# Patient Record
Sex: Male | Born: 1940 | Hispanic: No | Marital: Married
Health system: Southern US, Community
[De-identification: ages and names within clinical notes are randomized; demographics above are authoritative.]

---

## 2006-05-28 ENCOUNTER — Emergency Department: Payer: Self-pay | Admitting: Emergency Medicine

## 2006-07-06 ENCOUNTER — Emergency Department: Payer: Self-pay | Admitting: Emergency Medicine

## 2007-07-23 ENCOUNTER — Inpatient Hospital Stay: Payer: Self-pay | Admitting: Internal Medicine

## 2007-07-23 ENCOUNTER — Other Ambulatory Visit: Payer: Self-pay

## 2007-10-26 ENCOUNTER — Other Ambulatory Visit: Payer: Self-pay

## 2007-10-26 ENCOUNTER — Emergency Department: Payer: Self-pay | Admitting: Emergency Medicine

## 2008-08-05 ENCOUNTER — Inpatient Hospital Stay: Payer: Self-pay | Admitting: Psychiatry

## 2008-08-11 ENCOUNTER — Inpatient Hospital Stay: Payer: Self-pay | Admitting: Internal Medicine

## 2008-12-17 ENCOUNTER — Ambulatory Visit: Payer: Self-pay | Admitting: Internal Medicine

## 2008-12-24 ENCOUNTER — Ambulatory Visit: Payer: Self-pay | Admitting: Internal Medicine

## 2008-12-24 ENCOUNTER — Inpatient Hospital Stay: Payer: Self-pay | Admitting: Specialist

## 2009-01-17 ENCOUNTER — Ambulatory Visit: Payer: Self-pay | Admitting: Internal Medicine

## 2009-03-17 ENCOUNTER — Ambulatory Visit: Payer: Self-pay | Admitting: Internal Medicine

## 2009-03-25 ENCOUNTER — Ambulatory Visit: Payer: Self-pay | Admitting: Internal Medicine

## 2009-03-27 ENCOUNTER — Ambulatory Visit: Payer: Self-pay | Admitting: Internal Medicine

## 2009-04-17 ENCOUNTER — Ambulatory Visit: Payer: Self-pay | Admitting: Internal Medicine

## 2009-04-27 ENCOUNTER — Inpatient Hospital Stay: Payer: Self-pay | Admitting: Internal Medicine

## 2009-05-17 ENCOUNTER — Ambulatory Visit: Payer: Self-pay | Admitting: Internal Medicine

## 2009-08-26 ENCOUNTER — Ambulatory Visit: Payer: Self-pay | Admitting: Internal Medicine

## 2009-09-17 ENCOUNTER — Ambulatory Visit: Payer: Self-pay | Admitting: Internal Medicine

## 2010-01-08 ENCOUNTER — Ambulatory Visit: Payer: Self-pay | Admitting: Internal Medicine

## 2010-01-17 ENCOUNTER — Ambulatory Visit: Payer: Self-pay | Admitting: Internal Medicine

## 2010-07-19 ENCOUNTER — Ambulatory Visit: Payer: Self-pay | Admitting: Internal Medicine

## 2010-08-18 ENCOUNTER — Ambulatory Visit: Payer: Self-pay | Admitting: Internal Medicine

## 2011-01-19 ENCOUNTER — Ambulatory Visit: Payer: Self-pay | Admitting: Internal Medicine

## 2011-01-19 LAB — CBC CANCER CENTER
Basophil #: 0 x10 3/mm (ref 0.0–0.1)
Eosinophil #: 0 x10 3/mm (ref 0.0–0.7)
Eosinophil %: 0.4 %
HCT: 42.1 % (ref 40.0–52.0)
HGB: 14 g/dL (ref 13.0–18.0)
Lymphocyte #: 1 x10 3/mm (ref 1.0–3.6)
MCH: 31.5 pg (ref 26.0–34.0)
MCHC: 33.2 g/dL (ref 32.0–36.0)
MCV: 95 fL (ref 80–100)
Monocyte #: 0.4 x10 3/mm (ref 0.0–0.7)
Monocyte %: 14.8 %
Platelet: 137 x10 3/mm — ABNORMAL LOW (ref 150–440)
RBC: 4.43 10*6/uL (ref 4.40–5.90)
WBC: 2.6 x10 3/mm — ABNORMAL LOW (ref 3.8–10.6)

## 2011-01-19 LAB — HEPATIC FUNCTION PANEL A (ARMC)
Albumin: 2.9 g/dL — ABNORMAL LOW (ref 3.4–5.0)
Alkaline Phosphatase: 101 U/L (ref 50–136)
Bilirubin, Direct: 0.1 mg/dL (ref 0.00–0.20)
Bilirubin,Total: 0.4 mg/dL (ref 0.2–1.0)
SGPT (ALT): 9 U/L — ABNORMAL LOW
Total Protein: 8.4 g/dL — ABNORMAL HIGH (ref 6.4–8.2)

## 2011-01-19 LAB — BASIC METABOLIC PANEL
BUN: 12 mg/dL (ref 7–18)
Chloride: 101 mmol/L (ref 98–107)
EGFR (African American): >60
EGFR (Non-African Amer.): >60
Glucose: 89 mg/dL (ref 65–99)
Osmolality: 273 (ref 275–301)
Potassium: 3.9 mmol/L (ref 3.5–5.1)
Sodium: 137 mmol/L (ref 136–145)

## 2011-02-18 ENCOUNTER — Ambulatory Visit: Payer: Self-pay | Admitting: Internal Medicine

## 2011-03-31 IMAGING — CR DG CHEST 1V PORT
1 series · 1 of 1 positions shown · non-contrast
Comparison: none

REASON FOR EXAM: weakness, nausea and vomiting- hx of lung ca
COMMENTS:

[view not recorded]
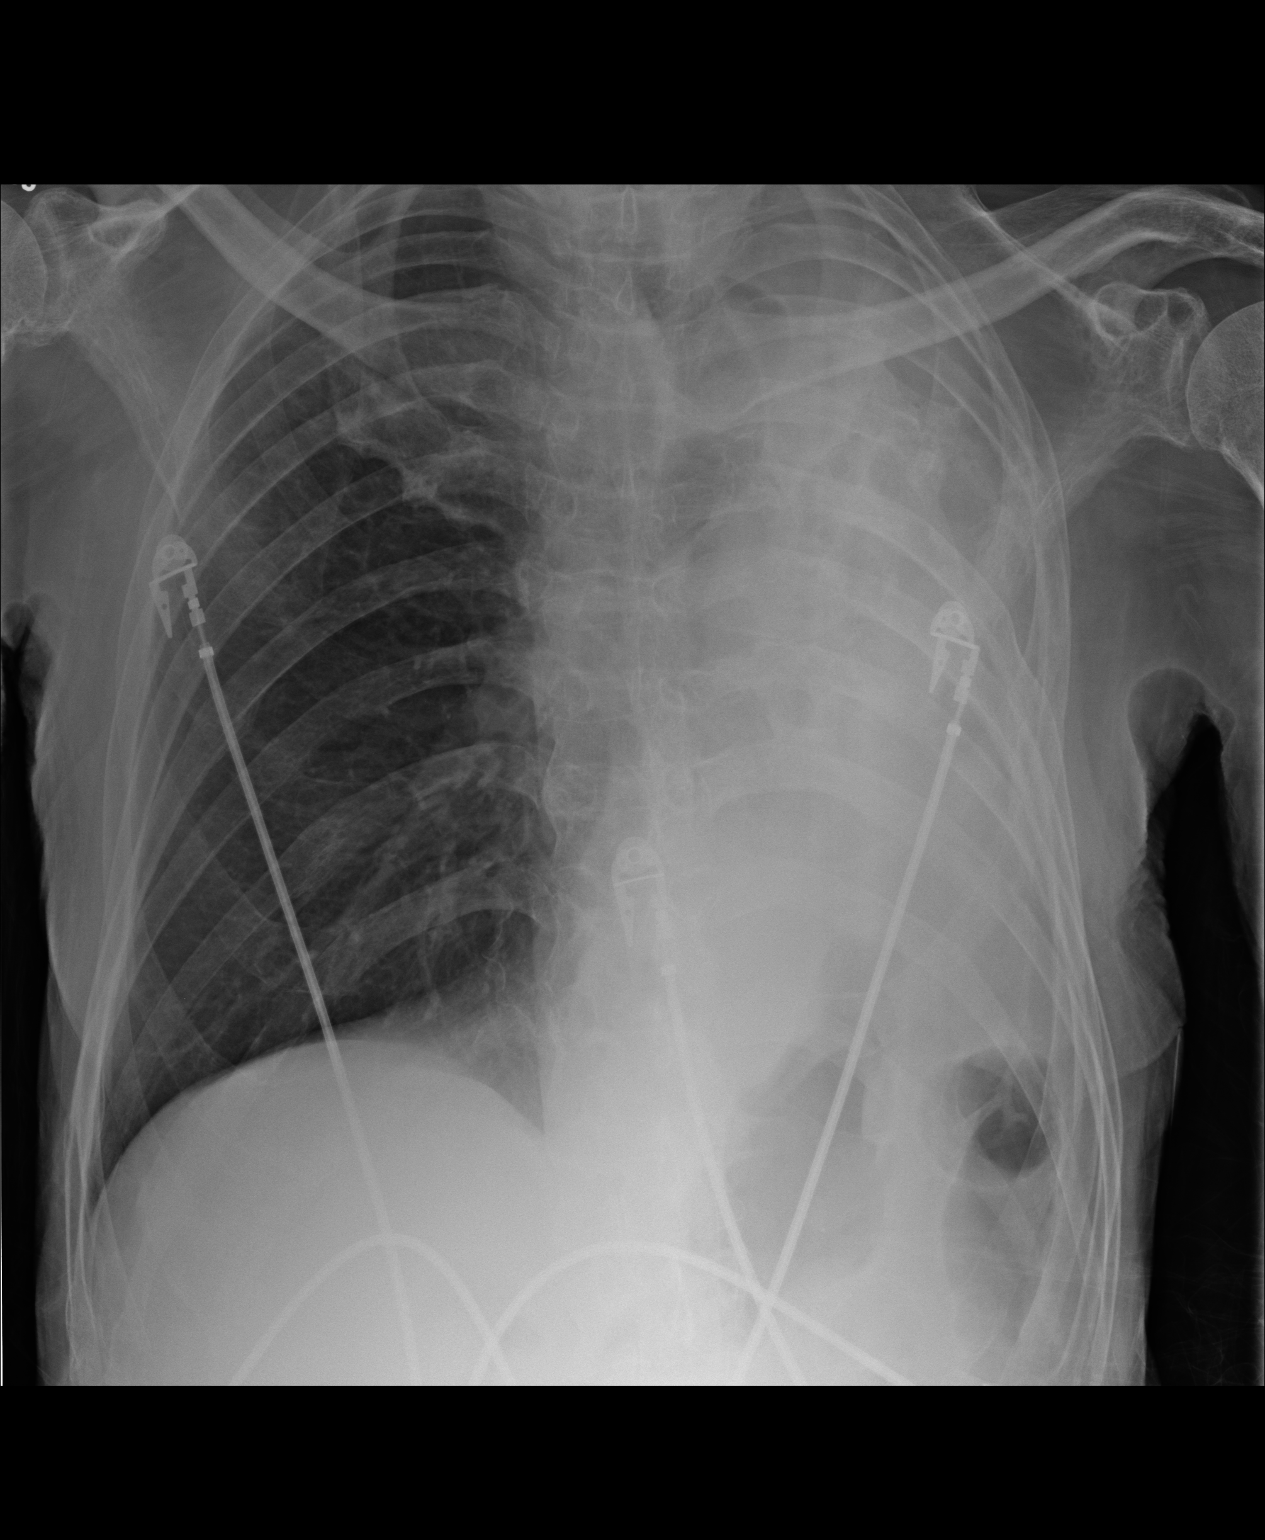

[1 of 1 positions shown; findings below may reference images not displayed]

PROCEDURE:     DXR - DXR PORTABLE CHEST SINGLE VIEW  - December 23, 2008  [DATE]

RESULT:     Comparison is made to the study of 08/05/2008 and to an exam of
08/11/2008. There is continued opacification of the left hemithorax. The
right lung is clear. There is a shift of the trachea toward the left.
Cardiac monitoring electrodes are present.
IMPRESSION: 1.     Stable chest x-ray.
2.     It appears the patient is status post pneumonectomy.
3.     The right lung appears clear.

## 2011-04-01 IMAGING — CT CT CHEST W/ CM
1 of 2 series · 14 of 32 positions shown, 18 images · non-contrast
Comparison: none

REASON FOR EXAM: sob/weakness
COMMENTS:

[Series 6: lung windows · axial · 0.77mm/px · z∈[-390,-126]mm · 14 of 106 slices shown, 18 images]
[im 9/106  mediastinal]
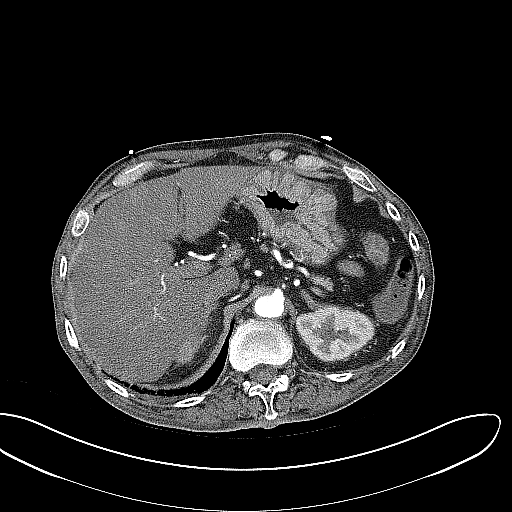
[im 9/106  lung]
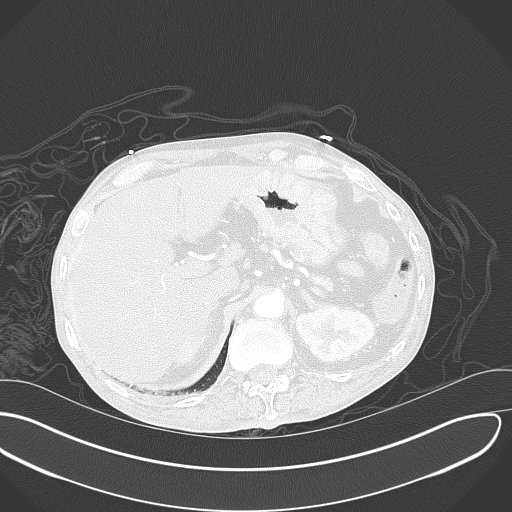
[im 17/106  lung]
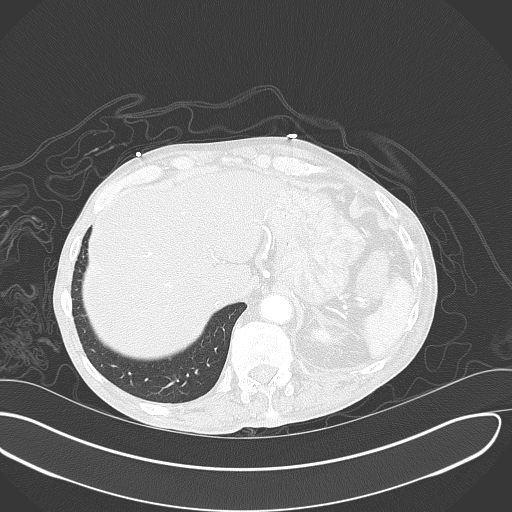
[im 25/106  lung]
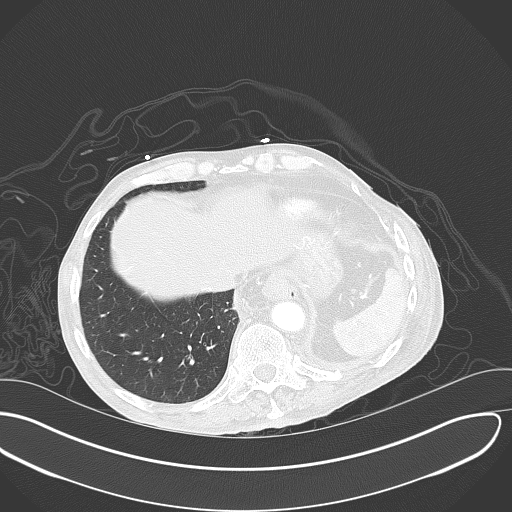
[im 33/106  lung]
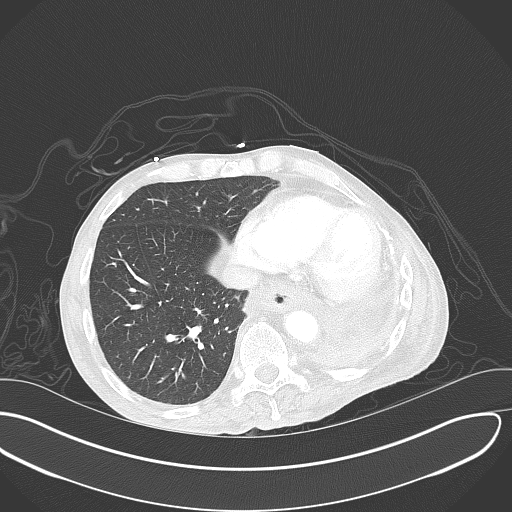
[im 41/106  mediastinal]
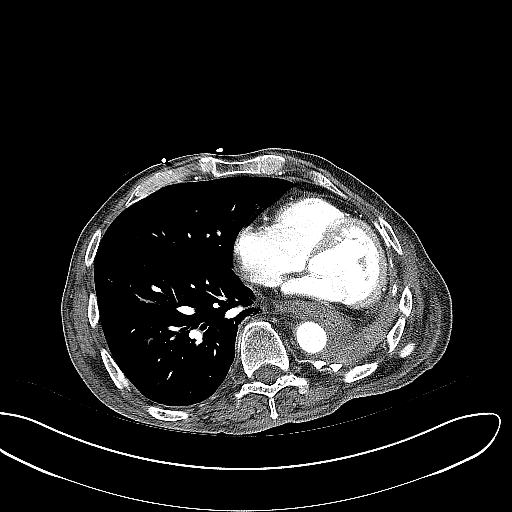
[im 41/106  lung]
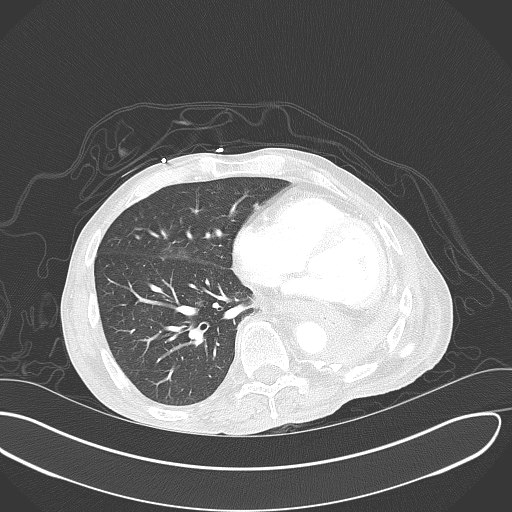
[im 49/106  lung]
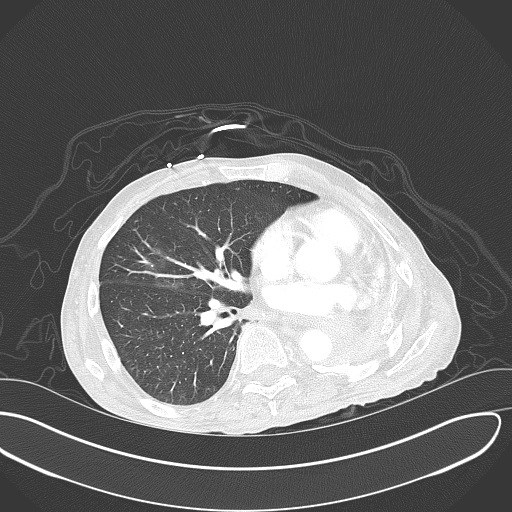
[im 50/106  lung]
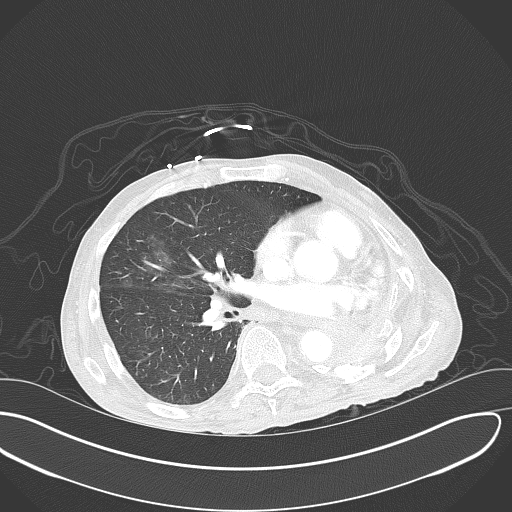
[im 53/106  lung]
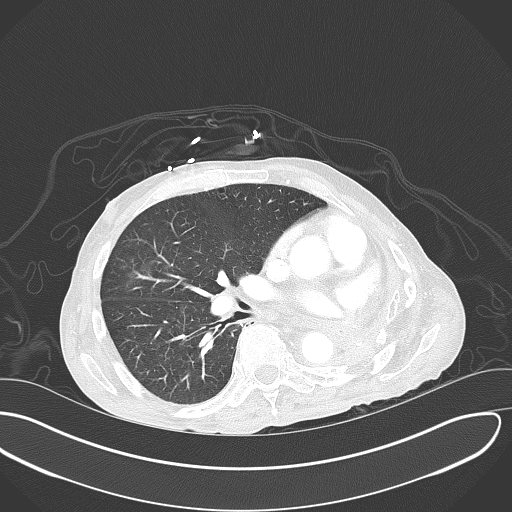
[im 57/106  mediastinal]
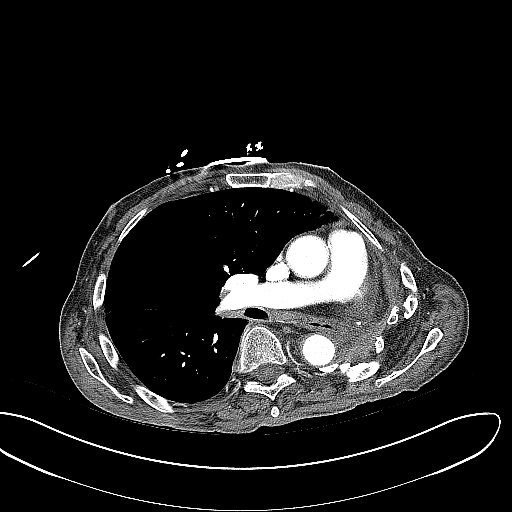
[im 57/106  lung]
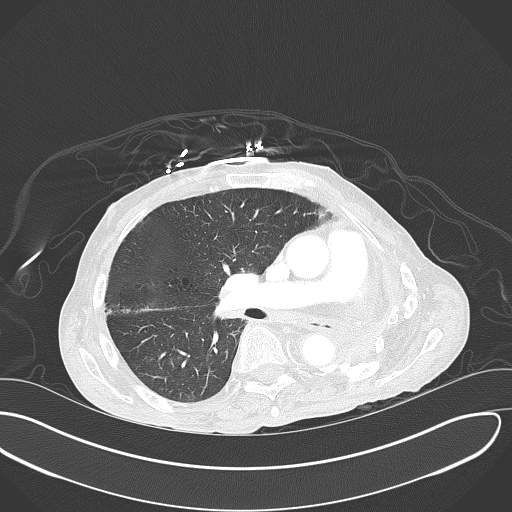
[im 65/106  lung]
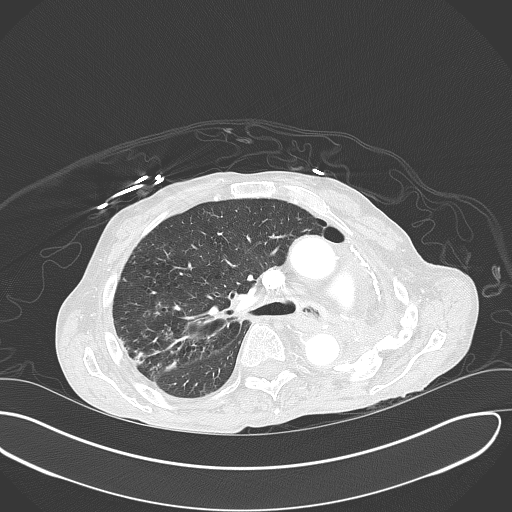
[im 73/106  lung]
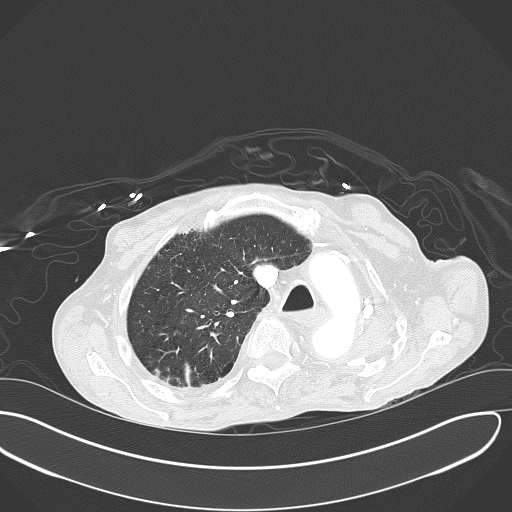
[im 81/106  lung]
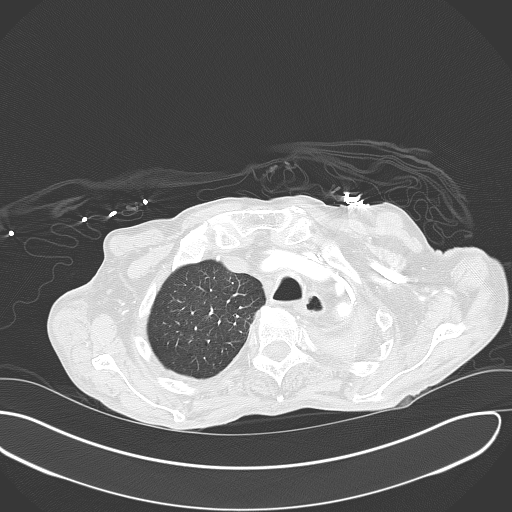
[im 89/106  mediastinal]
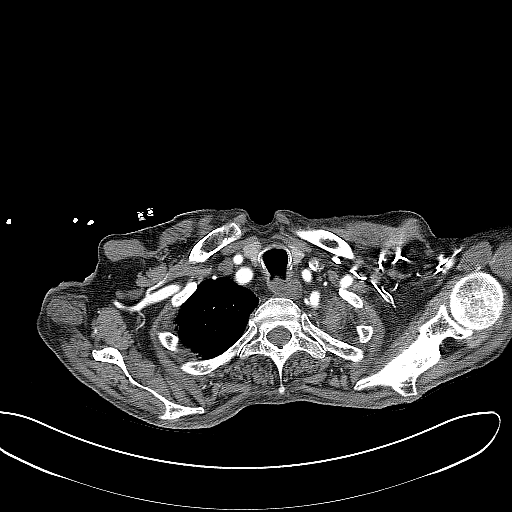
[im 89/106  lung]
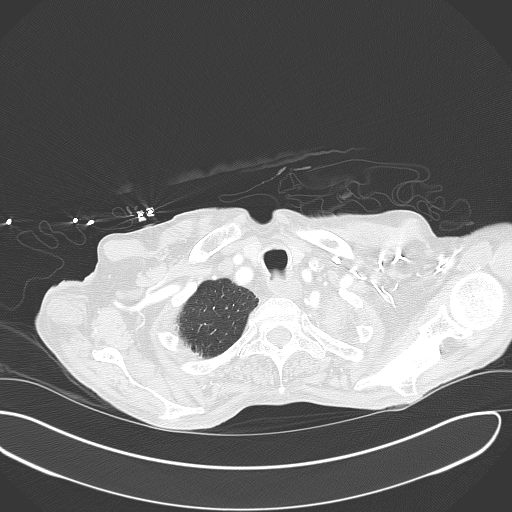
[im 97/106  lung]
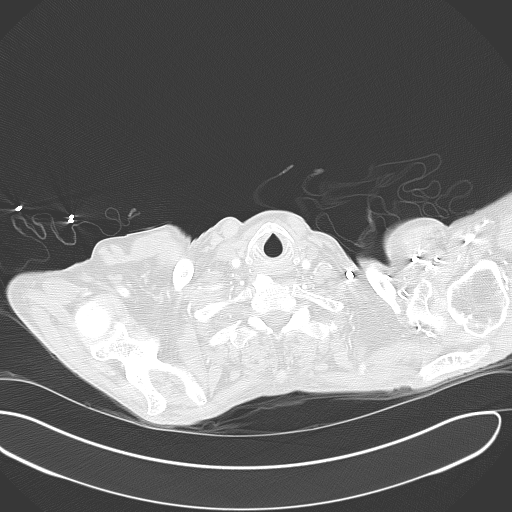

[14 of 32 positions shown; findings below may reference images not displayed]

PROCEDURE:     CT  - CT CHEST (FOR PE) W  - December 24, 2008  [DATE]

RESULT:     Emergent CT of the chest is performed utilizing a pulmonary
embolism technique with 80 mL of [id number] iodinated intravenous contrast.
Images are reconstructed in the axial plane at 3 mm slice thickness.
Comparison is made to images from a chest, abdomen and pelvis CT dated
08/06/2008.

Changes of a left pneumonectomy are present with hyperinflation of the right
lung. There is a moderate sized hiatal hernia. There is persistent right
hydronephrosis with renal cortical thinning. No definite mediastinal or
right hilar adenopathy is evident. There is some fluid in the left pleural
cavity. The right lung demonstrates some minimal density in the right upper
lobe along the major fissure. There is bullous region in the posterior right
upper lobe anterior to the major fissure as well. There is some minimal
linear density in the right upper lobe posteriorly which could be
atelectasis or fibrosis. A discrete right lung mass is not appreciated. No
pulmonary embolism or aortic aneurysm is evident. There is no aortic
dissection. Patchy density in the right lung along the major fissure could
represent atelectasis or minimal infiltrate. The bullous change is stable.
Previous nodularity in the right lung on image 20 of the most recent study
is less prominent.
IMPRESSION: 1. Postsurgical changes with chronic changes in the right lung. There may be
some underlying atelectasis or minimal infiltrate present.
2. Stable right hydronephrosis with renal cortical thinning.
3. Hiatal hernia.
4. There may be some mild thickening of the wall of the distal esophagus
which is not mentioned above.
5. A bullous area in the right lung is noted.
6. No pulmonary embolism.

## 2011-04-01 IMAGING — US US RENAL KIDNEY
1 series · 17 of 25 positions shown · non-contrast
Comparison: none

REASON FOR EXAM: rt sided hydroneprosis
COMMENTS:

[Series 1: us renal kidney · 17 of 31 slices shown]
[im 1/31]
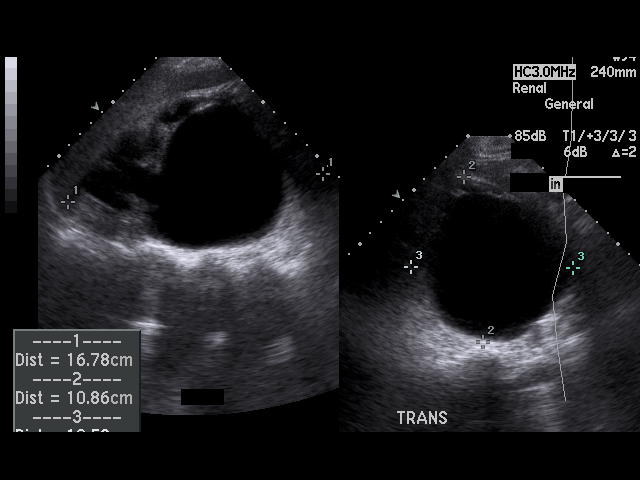
[im 3/31]
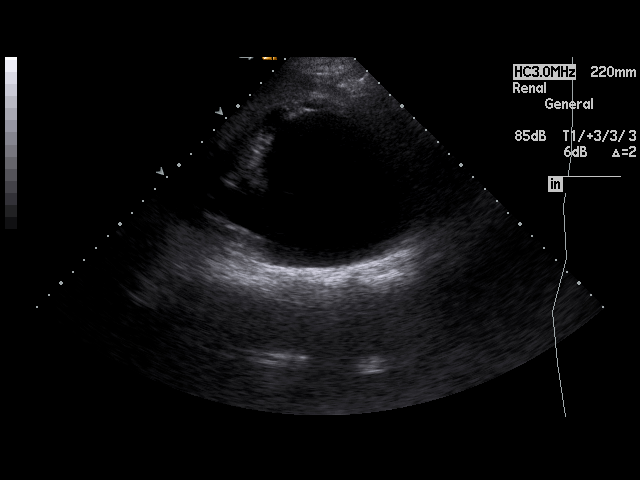
[im 4/31]
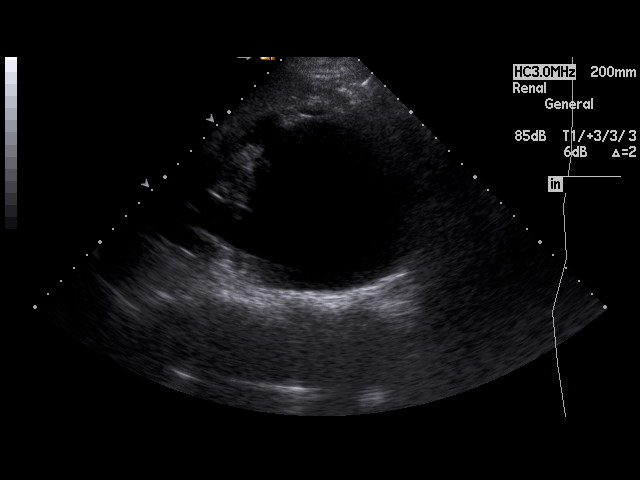
[im 7/31]
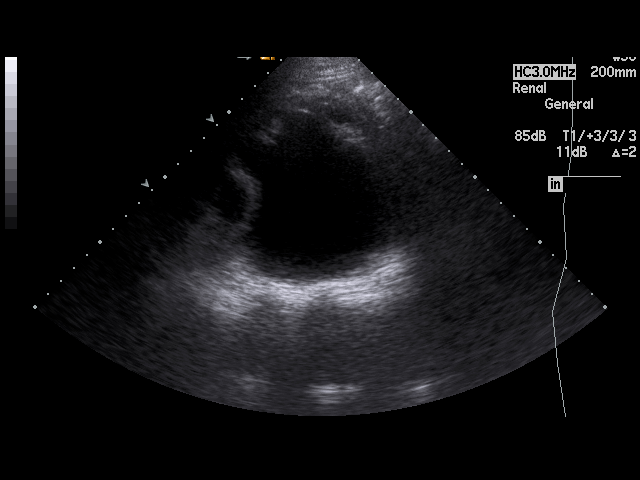
[im 8/31]
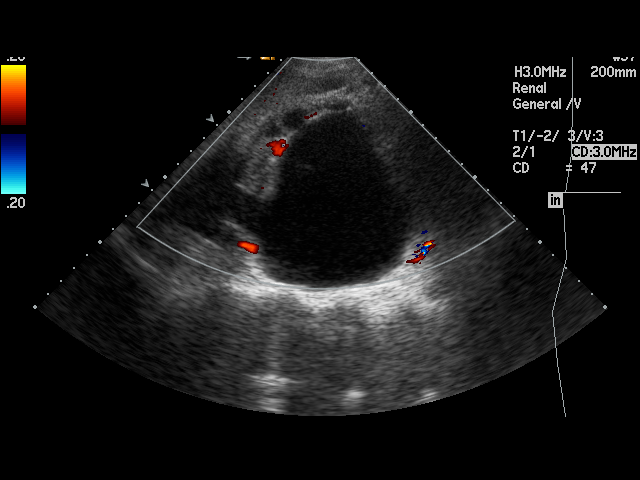
[im 11/31]
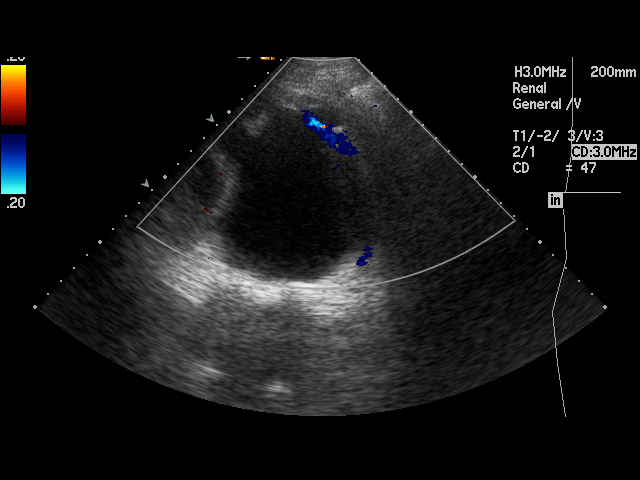
[im 12/31]
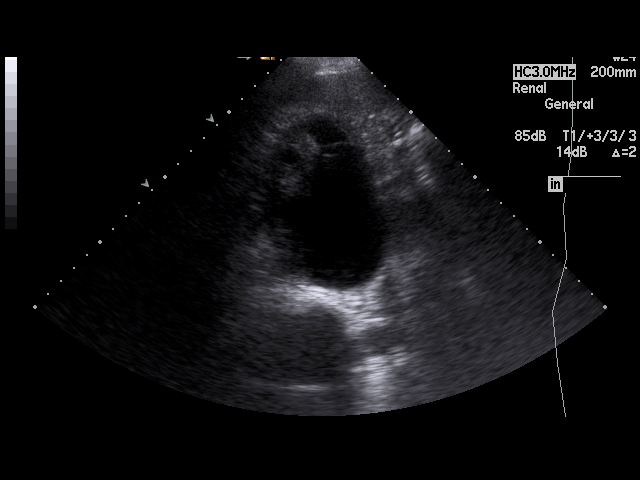
[im 14/31]
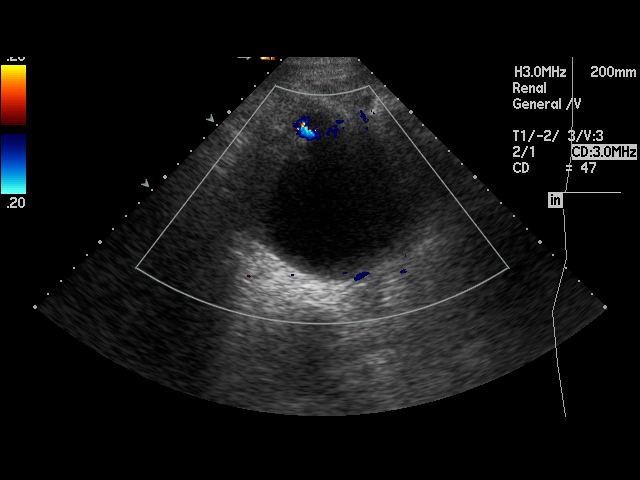
[im 16/31]
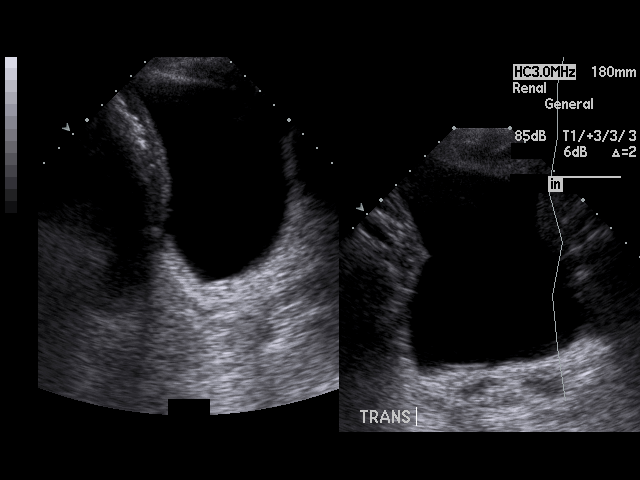
[im 17/31]
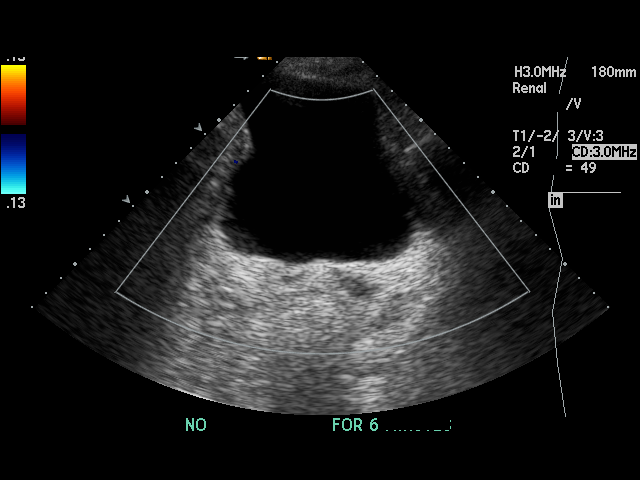
[im 19/31]
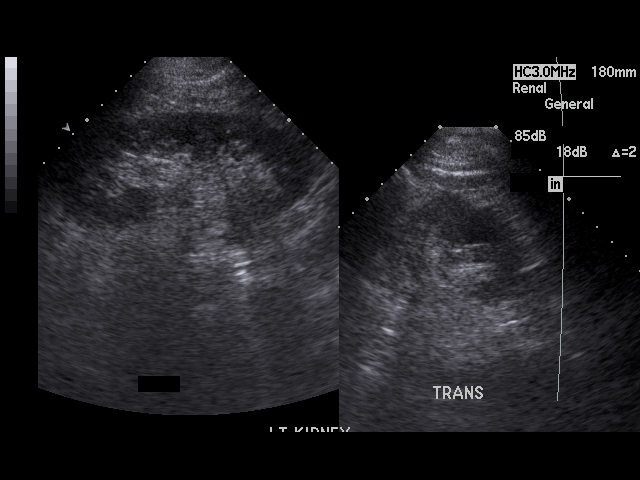
[im 21/31]
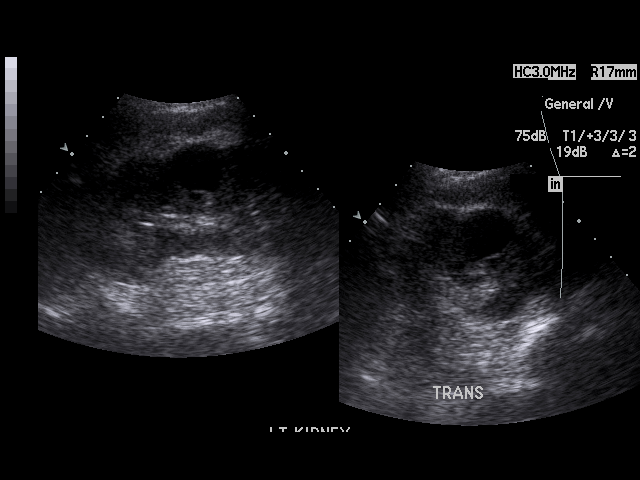
[im 23/31]
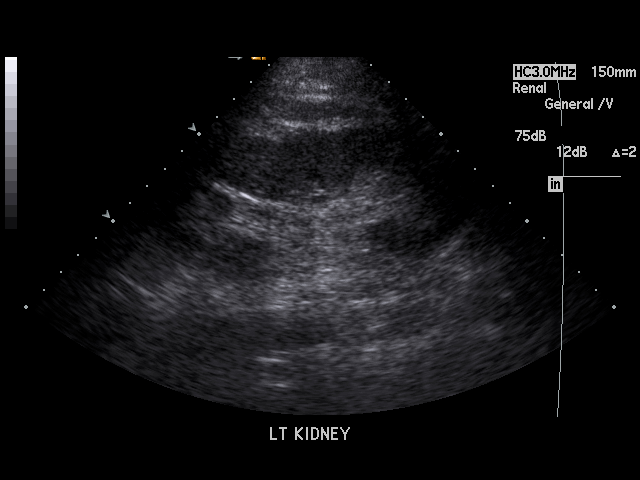
[im 24/31]
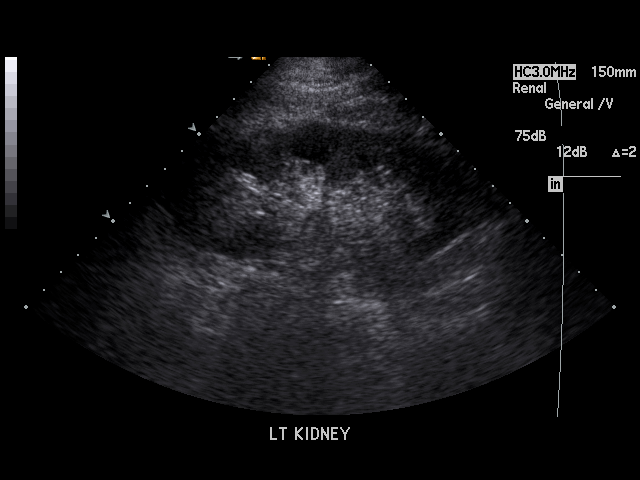
[im 27/31]
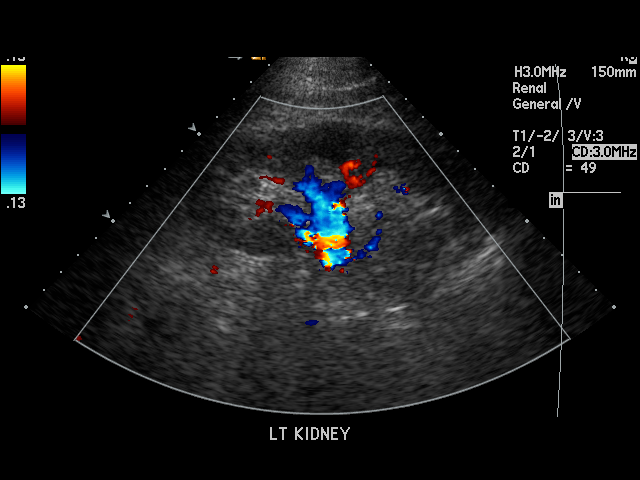
[im 28/31]
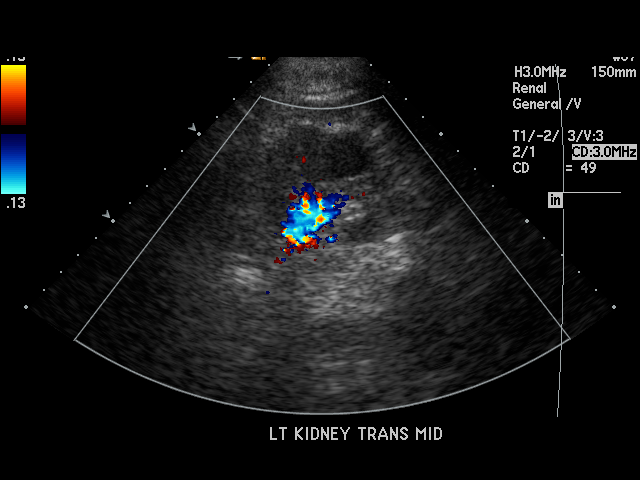
[im 31/31]
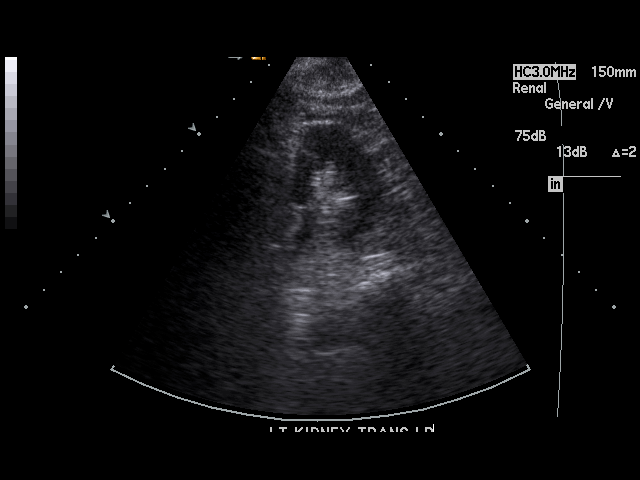

[17 of 25 positions shown; findings below may reference images not displayed]

PROCEDURE:     US  - US KIDNEY  - December 24, 2008  [DATE]

RESULT:     The right kidney is enlarged measuring 16.8 x 10.9 x 10.6 cm.
There is severe hydronephrosis. The right kidney measures 13.1 x 6.4 x
cm. It does not exhibit evidence of obstruction. There is a midpole cystic
structure measuring 2.1 cm in greatest dimension on the left. Survey views
of the urinary bladder reveal it to be moderately distended.
IMPRESSION: There is severe hydronephrosis on the right. The left
kidney appears normal with a simple appearing cyst in its mid pole.

## 2011-08-02 ENCOUNTER — Ambulatory Visit: Payer: Self-pay | Admitting: Internal Medicine

## 2011-08-08 ENCOUNTER — Ambulatory Visit: Payer: Self-pay | Admitting: Internal Medicine

## 2011-08-08 LAB — CBC CANCER CENTER
Basophil %: 1 %
Eosinophil #: 0 x10 3/mm (ref 0.0–0.7)
HGB: 14.5 g/dL (ref 13.0–18.0)
Lymphocyte %: 39.4 %
MCH: 31.5 pg (ref 26.0–34.0)
MCV: 96 fL (ref 80–100)
Monocyte #: 0.4 x10 3/mm (ref 0.2–1.0)
Neutrophil #: 1.2 x10 3/mm — ABNORMAL LOW (ref 1.4–6.5)
Neutrophil %: 45.1 %
RDW: 15.1 % — ABNORMAL HIGH (ref 11.5–14.5)
WBC: 2.7 x10 3/mm — ABNORMAL LOW (ref 3.8–10.6)

## 2011-08-08 LAB — CREATININE, SERUM
Creatinine: 1.32 mg/dL — ABNORMAL HIGH (ref 0.60–1.30)
EGFR (African American): >60
EGFR (Non-African Amer.): 54 — ABNORMAL LOW

## 2011-08-08 LAB — HEPATIC FUNCTION PANEL A (ARMC)
Bilirubin,Total: 0.4 mg/dL (ref 0.2–1.0)
SGOT(AST): 37 U/L (ref 15–37)
SGPT (ALT): 17 U/L

## 2011-08-18 ENCOUNTER — Ambulatory Visit: Payer: Self-pay | Admitting: Internal Medicine

## 2011-08-22 LAB — IRON AND TIBC: Iron Saturation: 50 %

## 2011-08-22 LAB — PROTIME-INR
INR: 0.9
Prothrombin Time: 12.7 secs (ref 11.5–14.7)

## 2011-08-22 LAB — CBC CANCER CENTER
HCT: 42.8 % (ref 40.0–52.0)
HGB: 14.4 g/dL (ref 13.0–18.0)
Lymphocytes: 60 %
MCH: 32.5 pg (ref 26.0–34.0)
MCHC: 33.6 g/dL (ref 32.0–36.0)
MCV: 97 fL (ref 80–100)
RDW: 15.2 % — ABNORMAL HIGH (ref 11.5–14.5)

## 2011-08-22 LAB — FOLATE: Folic Acid: 4.1 ng/mL (ref 3.1–100.0)

## 2011-08-22 LAB — RETICULOCYTES: Reticulocyte: 0.67 % — ABNORMAL LOW (ref 0.7–2.5)

## 2011-08-22 LAB — LACTATE DEHYDROGENASE: LDH: 146 U/L (ref 81–234)

## 2011-09-13 LAB — CBC CANCER CENTER
HCT: 42.6 % (ref 40.0–52.0)
HGB: 13.8 g/dL (ref 13.0–18.0)
Lymphocytes: 32 %
MCV: 97 fL (ref 80–100)
Metamyelocyte: 1 %
RBC: 4.41 10*6/uL (ref 4.40–5.90)
RDW: 14.3 % (ref 11.5–14.5)
Segmented Neutrophils: 52 %
Variant Lymphocyte: 1 %
WBC: 2.8 x10 3/mm — ABNORMAL LOW (ref 3.8–10.6)

## 2011-09-13 LAB — RETICULOCYTES: Absolute Retic Count: 0.0178 10*6/uL — ABNORMAL LOW (ref 0.031–0.129)

## 2011-09-18 ENCOUNTER — Ambulatory Visit: Payer: Self-pay | Admitting: Internal Medicine

## 2011-09-20 LAB — CBC CANCER CENTER
Basophil %: 1.2 %
Eosinophil %: 0.4 %
HCT: 43.5 % (ref 40.0–52.0)
Lymphocyte #: 1 x10 3/mm (ref 1.0–3.6)
MCH: 31 pg (ref 26.0–34.0)
MCV: 96 fL (ref 80–100)
Monocyte %: 14.8 %
Neutrophil #: 1 x10 3/mm — ABNORMAL LOW (ref 1.4–6.5)
RBC: 4.54 10*6/uL (ref 4.40–5.90)
RDW: 13.6 % (ref 11.5–14.5)
WBC: 2.4 x10 3/mm — ABNORMAL LOW (ref 3.8–10.6)

## 2011-10-17 LAB — CBC CANCER CENTER
Basophil %: 0.7 %
Eosinophil #: 0 x10 3/mm (ref 0.0–0.7)
HCT: 41.6 % (ref 40.0–52.0)
HGB: 13.5 g/dL (ref 13.0–18.0)
MCH: 30.6 pg (ref 26.0–34.0)
MCHC: 32.4 g/dL (ref 32.0–36.0)
Monocyte #: 0.4 x10 3/mm (ref 0.2–1.0)
Neutrophil #: 5.4 x10 3/mm (ref 1.4–6.5)
Neutrophil %: 84.1 %
Platelet: 97 x10 3/mm — ABNORMAL LOW (ref 150–440)

## 2011-10-18 ENCOUNTER — Ambulatory Visit: Payer: Self-pay | Admitting: Internal Medicine

## 2011-12-19 ENCOUNTER — Ambulatory Visit: Payer: Self-pay | Admitting: Internal Medicine

## 2011-12-19 LAB — CBC CANCER CENTER
Basophil %: 0.8 %
Eosinophil %: 0.3 %
HGB: 13.2 g/dL (ref 13.0–18.0)
MCHC: 33.1 g/dL (ref 32.0–36.0)
MCV: 93 fL (ref 80–100)
Monocyte %: 15.3 %
Neutrophil %: 63.6 %
Platelet: 132 x10 3/mm — ABNORMAL LOW (ref 150–440)
RBC: 4.3 10*6/uL — ABNORMAL LOW (ref 4.40–5.90)
WBC: 4 x10 3/mm (ref 3.8–10.6)

## 2012-01-18 ENCOUNTER — Ambulatory Visit: Payer: Self-pay | Admitting: Internal Medicine

## 2012-05-17 ENCOUNTER — Ambulatory Visit: Payer: Self-pay | Admitting: Internal Medicine

## 2012-06-13 LAB — URINALYSIS, COMPLETE
Bilirubin,UR: NEGATIVE
Glucose,UR: NEGATIVE mg/dL (ref 0–75)
Hyaline Cast: 3
Nitrite: NEGATIVE
Ph: 6 (ref 4.5–8.0)
Protein: NEGATIVE
Specific Gravity: 1.036 (ref 1.003–1.030)

## 2012-06-13 LAB — COMPREHENSIVE METABOLIC PANEL
Alkaline Phosphatase: 81 U/L (ref 50–136)
BUN: 16 mg/dL (ref 7–18)
Bilirubin,Total: 0.5 mg/dL (ref 0.2–1.0)
Calcium, Total: 9.3 mg/dL (ref 8.5–10.1)
Co2: 23 mmol/L (ref 21–32)
EGFR (African American): >60
EGFR (Non-African Amer.): 55 — ABNORMAL LOW
Glucose: 145 mg/dL — ABNORMAL HIGH (ref 65–99)
Osmolality: 266 (ref 275–301)
Potassium: 4.4 mmol/L (ref 3.5–5.1)
SGOT(AST): 20 U/L (ref 15–37)
Total Protein: 9.6 g/dL — ABNORMAL HIGH (ref 6.4–8.2)

## 2012-06-13 LAB — CBC
MCHC: 33.2 g/dL (ref 32.0–36.0)
Platelet: 134 10*3/uL — ABNORMAL LOW (ref 150–440)
RDW: 14.3 % (ref 11.5–14.5)
WBC: 4 10*3/uL (ref 3.8–10.6)

## 2012-06-13 LAB — TROPONIN I: Troponin-I: <0.02 ng/mL

## 2012-06-14 ENCOUNTER — Inpatient Hospital Stay: Payer: Self-pay | Admitting: Internal Medicine

## 2012-06-14 LAB — CBC WITH DIFFERENTIAL/PLATELET
Basophil #: 0 10*3/uL (ref 0.0–0.1)
Basophil %: 0.4 %
Eosinophil %: 0.2 %
HGB: 11.3 g/dL — ABNORMAL LOW (ref 13.0–18.0)
MCH: 29.8 pg (ref 26.0–34.0)
Monocyte #: 0.8 x10 3/mm (ref 0.2–1.0)
Monocyte %: 20.8 %
Platelet: 117 10*3/uL — ABNORMAL LOW (ref 150–440)
WBC: 3.7 10*3/uL — ABNORMAL LOW (ref 3.8–10.6)

## 2012-06-14 LAB — BASIC METABOLIC PANEL
Anion Gap: 6 — ABNORMAL LOW (ref 7–16)
Chloride: 104 mmol/L (ref 98–107)
Co2: 26 mmol/L (ref 21–32)
Creatinine: 1.12 mg/dL (ref 0.60–1.30)
EGFR (Non-African Amer.): >60
Glucose: 94 mg/dL (ref 65–99)
Sodium: 136 mmol/L (ref 136–145)

## 2012-06-14 LAB — TSH: Thyroid Stimulating Horm: 8.99 u[IU]/mL — ABNORMAL HIGH

## 2012-06-15 LAB — T4, FREE: Free Thyroxine: 1.35 ng/dL (ref 0.76–1.46)

## 2012-06-15 LAB — MAGNESIUM: Magnesium: 1.3 mg/dL — ABNORMAL LOW

## 2012-06-15 LAB — BASIC METABOLIC PANEL
Anion Gap: 3 — ABNORMAL LOW (ref 7–16)
Calcium, Total: 7.8 mg/dL — ABNORMAL LOW (ref 8.5–10.1)
EGFR (African American): >60
Potassium: 3.6 mmol/L (ref 3.5–5.1)
Sodium: 135 mmol/L — ABNORMAL LOW (ref 136–145)

## 2012-06-19 ENCOUNTER — Ambulatory Visit: Payer: Self-pay | Admitting: Internal Medicine

## 2012-06-19 LAB — CBC CANCER CENTER
Basophil #: 0 x10 3/mm (ref 0.0–0.1)
Basophil %: 0.7 %
Eosinophil #: 0 x10 3/mm (ref 0.0–0.7)
Eosinophil %: 0.1 %
Lymphocyte #: 0.8 x10 3/mm — ABNORMAL LOW (ref 1.0–3.6)
Lymphocyte %: 16.8 %
MCH: 29.8 pg (ref 26.0–34.0)
MCHC: 33.6 g/dL (ref 32.0–36.0)
MCV: 89 fL (ref 80–100)
Monocyte #: 0.5 x10 3/mm (ref 0.2–1.0)
RDW: 14.5 % (ref 11.5–14.5)
WBC: 4.7 x10 3/mm (ref 3.8–10.6)

## 2012-06-19 LAB — CALCIUM: Calcium, Total: 9.4 mg/dL (ref 8.5–10.1)

## 2012-06-19 LAB — PROTIME-INR
INR: 0.9
Prothrombin Time: 12.7 secs (ref 11.5–14.7)

## 2012-06-19 LAB — CREATININE, SERUM: Creatinine: 1.56 mg/dL — ABNORMAL HIGH (ref 0.60–1.30)

## 2012-06-20 ENCOUNTER — Inpatient Hospital Stay: Payer: Self-pay | Admitting: Internal Medicine

## 2012-06-20 LAB — CBC
HCT: 34.2 % — ABNORMAL LOW (ref 40.0–52.0)
HGB: 11.4 g/dL — ABNORMAL LOW (ref 13.0–18.0)
MCH: 29.2 pg (ref 26.0–34.0)
RBC: 3.92 10*6/uL — ABNORMAL LOW (ref 4.40–5.90)
RDW: 13.8 % (ref 11.5–14.5)
WBC: 4.5 10*3/uL (ref 3.8–10.6)

## 2012-06-20 LAB — CBC WITH DIFFERENTIAL/PLATELET
Basophil #: 0 10*3/uL (ref 0.0–0.1)
Basophil %: 0.4 %
Eosinophil %: 0 %
HCT: 38 % — ABNORMAL LOW (ref 40.0–52.0)
HGB: 12.9 g/dL — ABNORMAL LOW (ref 13.0–18.0)
Lymphocyte %: 11.9 %
MCH: 29.5 pg (ref 26.0–34.0)
MCHC: 33.9 g/dL (ref 32.0–36.0)
MCV: 87 fL (ref 80–100)
Neutrophil #: 4.3 10*3/uL (ref 1.4–6.5)
Neutrophil %: 76.9 %
Platelet: 229 10*3/uL (ref 150–440)
RBC: 4.37 10*6/uL — ABNORMAL LOW (ref 4.40–5.90)
RDW: 14 % (ref 11.5–14.5)
WBC: 5.6 10*3/uL (ref 3.8–10.6)

## 2012-06-20 LAB — COMPREHENSIVE METABOLIC PANEL
Anion Gap: 9 (ref 7–16)
Co2: 25 mmol/L (ref 21–32)
Creatinine: 1.39 mg/dL — ABNORMAL HIGH (ref 0.60–1.30)
Glucose: 120 mg/dL — ABNORMAL HIGH (ref 65–99)
SGOT(AST): 25 U/L (ref 15–37)
SGPT (ALT): 8 U/L — ABNORMAL LOW (ref 12–78)
Total Protein: 8.9 g/dL — ABNORMAL HIGH (ref 6.4–8.2)

## 2012-06-20 LAB — URINALYSIS, COMPLETE
Bilirubin,UR: NEGATIVE
Blood: NEGATIVE
Leukocyte Esterase: NEGATIVE
Nitrite: NEGATIVE
Ph: 5 (ref 4.5–8.0)
Protein: NEGATIVE
RBC,UR: 1 /HPF (ref 0–5)
Squamous Epithelial: NONE SEEN
WBC UR: 1 /HPF (ref 0–5)

## 2012-06-20 LAB — CK TOTAL AND CKMB (NOT AT ARMC)
CK, Total: 38 U/L (ref 35–232)
CK-MB: 0.6 ng/mL (ref 0.5–3.6)

## 2012-06-20 LAB — PROTIME-INR: Prothrombin Time: 13.7 secs (ref 11.5–14.7)

## 2012-06-20 LAB — HEMOGLOBIN: HGB: 11.1 g/dL — ABNORMAL LOW (ref 13.0–18.0)

## 2012-07-13 ENCOUNTER — Inpatient Hospital Stay: Payer: Self-pay | Admitting: Internal Medicine

## 2012-07-13 LAB — COMPREHENSIVE METABOLIC PANEL
Alkaline Phosphatase: 92 U/L (ref 50–136)
Anion Gap: 9 (ref 7–16)
Bilirubin,Total: 0.6 mg/dL (ref 0.2–1.0)
Calcium, Total: 9.1 mg/dL (ref 8.5–10.1)
Chloride: 96 mmol/L — ABNORMAL LOW (ref 98–107)
EGFR (African American): >60
Osmolality: 258 (ref 275–301)
Potassium: 3.8 mmol/L (ref 3.5–5.1)
SGOT(AST): 20 U/L (ref 15–37)
SGPT (ALT): 10 U/L — ABNORMAL LOW (ref 12–78)
Sodium: 129 mmol/L — ABNORMAL LOW (ref 136–145)

## 2012-07-13 LAB — TROPONIN I: Troponin-I: <0.02 ng/mL

## 2012-07-13 LAB — CBC
HCT: 39.9 % — ABNORMAL LOW (ref 40.0–52.0)
HGB: 13.4 g/dL (ref 13.0–18.0)
MCHC: 33.6 g/dL (ref 32.0–36.0)
MCV: 86 fL (ref 80–100)
Platelet: 175 10*3/uL (ref 150–440)
RDW: 13.8 % (ref 11.5–14.5)
WBC: 6.4 10*3/uL (ref 3.8–10.6)

## 2012-07-13 LAB — CK TOTAL AND CKMB (NOT AT ARMC): CK-MB: 0.5 ng/mL (ref 0.5–3.6)

## 2012-07-14 LAB — CBC WITH DIFFERENTIAL/PLATELET
Basophil #: 0 10*3/uL (ref 0.0–0.1)
Basophil %: 0.1 %
Eosinophil %: 0 %
Lymphocyte #: 0.4 10*3/uL — ABNORMAL LOW (ref 1.0–3.6)
Lymphocyte %: 8.6 %
MCV: 85 fL (ref 80–100)
Monocyte #: 0.3 x10 3/mm (ref 0.2–1.0)
Monocyte %: 5.7 %
Neutrophil %: 85.6 %
RBC: 4.23 10*6/uL — ABNORMAL LOW (ref 4.40–5.90)
WBC: 5 10*3/uL (ref 3.8–10.6)

## 2012-07-14 LAB — BASIC METABOLIC PANEL
Anion Gap: 9 (ref 7–16)
BUN: 15 mg/dL (ref 7–18)
Chloride: 93 mmol/L — ABNORMAL LOW (ref 98–107)
Co2: 26 mmol/L (ref 21–32)
Creatinine: 1 mg/dL (ref 0.60–1.30)
EGFR (African American): >60
Sodium: 128 mmol/L — ABNORMAL LOW (ref 136–145)

## 2012-07-15 LAB — BASIC METABOLIC PANEL
Anion Gap: 9 (ref 7–16)
BUN: 20 mg/dL — ABNORMAL HIGH (ref 7–18)
Calcium, Total: 9.2 mg/dL (ref 8.5–10.1)
Chloride: 94 mmol/L — ABNORMAL LOW (ref 98–107)
Co2: 25 mmol/L (ref 21–32)
Creatinine: 0.95 mg/dL (ref 0.60–1.30)
EGFR (African American): >60
EGFR (Non-African Amer.): >60
Osmolality: 263 (ref 275–301)
Sodium: 128 mmol/L — ABNORMAL LOW (ref 136–145)

## 2012-07-15 LAB — CBC WITH DIFFERENTIAL/PLATELET
Eosinophil #: 0 10*3/uL (ref 0.0–0.7)
HCT: 34.8 % — ABNORMAL LOW (ref 40.0–52.0)
Lymphocyte #: 0.3 10*3/uL — ABNORMAL LOW (ref 1.0–3.6)
Lymphocyte %: 2.6 %
MCH: 28.9 pg (ref 26.0–34.0)
MCHC: 34.2 g/dL (ref 32.0–36.0)
Neutrophil #: 10.6 10*3/uL — ABNORMAL HIGH (ref 1.4–6.5)
Platelet: 207 10*3/uL (ref 150–440)
RBC: 4.11 10*6/uL — ABNORMAL LOW (ref 4.40–5.90)

## 2012-07-15 LAB — PROTIME-INR: INR: 1

## 2012-07-16 LAB — CBC WITH DIFFERENTIAL/PLATELET
Basophil %: 0 %
HCT: 36.2 % — ABNORMAL LOW (ref 40.0–52.0)
HGB: 12.1 g/dL — ABNORMAL LOW (ref 13.0–18.0)
Lymphocyte #: 0.2 10*3/uL — ABNORMAL LOW (ref 1.0–3.6)
MCH: 28.6 pg (ref 26.0–34.0)
MCHC: 33.5 g/dL (ref 32.0–36.0)
Monocyte %: 5.8 %
Neutrophil %: 92.3 %
RDW: 13.6 % (ref 11.5–14.5)
WBC: 9.6 10*3/uL (ref 3.8–10.6)

## 2012-07-16 LAB — BASIC METABOLIC PANEL
Anion Gap: 9 (ref 7–16)
Chloride: 96 mmol/L — ABNORMAL LOW (ref 98–107)
Creatinine: 1.06 mg/dL (ref 0.60–1.30)
EGFR (African American): >60
Osmolality: 267 (ref 275–301)

## 2012-07-16 LAB — PROTIME-INR
INR: 1
Prothrombin Time: 13 secs (ref 11.5–14.7)

## 2012-07-16 LAB — APTT: Activated PTT: <23 secs — ABNORMAL LOW (ref 23.6–35.9)

## 2012-07-17 ENCOUNTER — Ambulatory Visit: Payer: Self-pay | Admitting: Internal Medicine

## 2012-08-17 ENCOUNTER — Ambulatory Visit: Payer: Self-pay | Admitting: Internal Medicine

## 2012-08-17 DEATH — deceased

## 2014-05-09 NOTE — H&P (Signed)
PATIENT NAME:  Gabriel Avila, Gabriel Avila MR#:  [id number] DATE OF BIRTH:  [birth year]  DATE OF ADMISSION:  06/13/2012  ER REFERRING PHYSICIAN: Maurilio LovelyNoelle McLaurin, MD  PRIMARY CARE PHYSICIAN: Nonlocal  PRIMARY HEMATOLOGIST/ONCOLOGIST:  Maren ReamerSandeep R. Sherrlyn HockPandit, MD   CHIEF COMPLAINT: Not eating.  As per the patient, "My daughter made me come here today."   HISTORY OF PRESENT ILLNESS: Gabriel KalataVernon Avila is a very nice 74 year old gentleman who has history of left lung cancer, status post [facility]. The patient has had also a right lung nodule that has been followed by Dr. Sherrlyn HockPandit. The patient apparently has been stable, and he continues to smoke. He has history of previous stomach cancer and also has hypertension, COPD, history of hyperthyroidism, mild dementia and GERD. The patient comes here today. He seems stable but has a history of 15-pound weight loss, and his daughter came to visit him, and she felt that he needed to be seen for which she asked him to come to the ER. The patient was seen in the ER, was evaluated by Dr. Glenetta HewMcLaurin, who ordered a CT scan of the chest showing multiple pulmonary nodules that were not present before, for which the patient is admitted mostly for his failure to thrive, mild dehydration. The patient is a very poor historian likely related to his dementia, and he is not able to give me good information about it.  I tried to contact family, unable to reach them.   REVIEW OF SYSTEMS: CONSTITUTIONAL: The patient denies any fever, fatigue. Positive weakness. Positive weight loss of 15 pounds.  EYES: Denies any blurry vision, double vision, inflammation.  ENT: Denies any tinnitus, postnasal drip, sinus pain, difficulty swallowing.  RESPIRATORY: The patient states that he has occasional cough which is chronic in the morning, but no sputum. No fever, no signs of COPD exacerbation. He has COPD, and he continues to smoke.  CARDIOVASCULAR: Denies any chest pain, orthopnea, edema or  syncope.  GASTROINTESTINAL: No nausea, vomiting, abdominal pain. He denies any difficulty swallowing. He says that he is only does not have any appetite. He denies any constipation, melena or diarrhea.  GENITOURINARY: No dysuria, hematuria or changes in frequency. No prostatitis.  ENDOCRINOLOGY: No polyuria, polydipsia or polyphagia. No cold or heat intolerance. HEMATOLOGIC/LYMPHATIC: No anemia, easy bruising or bleeding. No swollen glands. Positive pulmonary nodule in the right upper lung by PET scan, followed by Dr. Sherrlyn HockPandit.  SKIN: No rashes, petechiae or moles.  MUSCULOSKELETAL: No significant gout, neck pain, back pain or any pain whatsoever, as the patient states.  NEUROLOGIC: No numbness, tingling. No CVAs or TIAs. No ataxia.  PSYCHIATRIC: Mild dementia. No history of insomnia, depression or agitation.   PAST MEDICAL AND SURGICAL HISTORY: 1.  History of lung cancer.  2.  COPD.  3.  History of stomach cancer.  4.  GERD.  5.  Hypertension.  6.  Mild dementia.  7.  Apparently history of hypothyroidism, not taking medications.  8.  History of previous respiratory failure requiring mechanical ventilation in 2010.  9.  Cachexia.  10.  Left pneumonectomy in 1997.  11.  Positive right upper lung pulmonary nodule.  12.  Apparently history of ischemic cardiomyopathy with an ejection fraction of 25%.  13.  History of esophagitis.  14. History of GI bleeding in the past.  15.  History of dysphagia.  16.  History of supraventricular tachycardia.  17.  History of chronic right side hydronephrosis.  18.  History of chronic alcohol abuse.  19.  History of chronic anemia.  20.  Protein caloric malnutrition, severe.   I cannot get any other information from him at this moment as much as I tried.    ALLERGIES: No known drug allergies.   FAMILY HISTORY: Positive for diabetes in parents and positive for coronary artery disease. Negative for cancer.   SOCIAL HISTORY: The patient is a smoker. He  cannot tell me how much he smokes at this moment.  He said he smokes several cigarettes a day. He apparently drinks on a regular basis, cannot tell me at this moment how much.  He has mild dementia for which most of his information is not reliable.  I tried to call family as they were in the room, but they left; and I have not been able to reach them and  try to reach them later on.   MEDICATIONS: The patient does not take any medications.   PHYSICAL EXAMINATION: VITAL SIGNS: Blood pressure 154/95, pulse 113 in admission, now 76. Temperature of 97.6, respirations 22 to 24.  GENERAL: The patient is alert. He is oriented on person. He knows that he is in the hospital, but he is not aware of time or date. He is pleasant and cooperative.  HEENT: Pupils are equal and reactive. Extraocular movements are intact. Mucosa are dry. Anicteric sclerae. Pink conjunctivae. No oral lesions. No oropharyngeal exudates.  NECK: Supple. No JVD. No thyromegaly. No adenopathy. No carotid bruits. No rigidity.  CARDIOVASCULAR: Regular rate and rhythm. No murmurs, rubs or gallops are appreciated. No displacement of PMI.  LUNGS: Showing some decreased respiratory sounds on the left side, and on the right side the patient has diffuse rhonchi, but he is not in any acute respiratory distress. He is not using any accessory muscles, no significant distress.  ABDOMEN: Soft, nontender, nondistended. No hepatosplenomegaly. No masses. Bowel sounds are positive.  EXTREMITIES: No edema, no cyanosis, no clubbing. Pulses +2. Capillary refill less than 3.  MUSCULOSKELETAL: No significant joint lesions or edema or swelling.  NEUROLOGIC: Cranial nerves II through XII are intact. Strength is 5/5 for 4 extremities.  PSYCHIATRIC: Mood is normal without any significant agitation. The patient is slightly confused.  SKIN: Without any rashes or petechiae.  LYMPHATICS: Negative for lymphadenopathy palpable at this moment in the neck or  supraclavicular areas. The patient is cachectic, malnourished but hemodynamically stable.  VASCULAR: Good pulses with good capillary refill.  GENITOURINARY: Negative for external lesions.   LABORATORY AND RADIOLOGICAL DATA:  CT scan showed no pulmonary embolus, multiple enlargement of mediastinal and right hilar lymph nodes concerning for malignancy given the patient's history.  The right hilar lymph nodes cause mild narrowing of the right upper lobe pulmonary arteries. There are scattered mild areas of ground-glass opacity as well of small irregular nodular densities of the right lung.  Nonspecific small areas of mottled density in the trachea and left mainstem bronchus may represent retained mucus.   Glucose 145, BUN 16, creatinine 1.28, sodium 131, albumin 2.8, total protein 9.6. LFTs within normal limits. Troponin 0.02.  Platelets 134. White count 4, hemoglobin 13.5, white blood cells in urine 3, trace leukocyte esterase.   ASSESSMENT AND PLAN: A 74 year old gentleman with history of lung cancer and positive right upper lobe lung nodule, noncompliance with treatment apparently, hypertension, chronic smoking, chronic alcohol use, cachexia, comes with history of , "My daughter made me come over here because I am not eating."   1.  Failure to thrive: The patient looks dehydrated.  I am going to start him on IV fluids 75 mL/hr.  The patient is hyponatremic which could be related to lung cancer, SIADH.  At this moment, we are going to do IV fluids and evaluate, and we are going to check urine sodium and urine osmolality.  Palliative Care consult to be obtained as well as Hematology/Oncology consult.  This failure to thrive is likely due to progression of cancer; as the patient has history of lung cancer in the past and a pulmonary nodule, this is likely to be a recurrence of tumor or a new cancer as the patient continues to smoke.  2.  Hyperglycemia:  Observe and recheck in the morning.  3.   Hyponatremia: Could be SIADH related to lung cancer or could be related to dehydration. We are going to put him on NS, but I am also going to check urine sodium and urine osmolality.  If his osmolality is really high, and his sodium in urine is high, we will discontinue fluids and actually do a possible fluid restriction.  4.  Thrombocytopenia:  The patient actually has history of pancytopenia. At this moment, his white count is 4, his hemoglobin is 13. His platelets are slightly decreased but not severely for which I am going to start him on heparin for DVT prophylaxis and  monitor.  5.  Cachexia and severe malnutrition:  We are going to get Palliative Care consult and Nutrition consult as well.  6.  At this moment, the patient is evaluated for his cancer. He has history of ischemic cardiomyopathy, chronic obstructive pulmonary disease. He does not take any medications at all. He continues to smoke and apparently continues to drink for which I will recommend him to have Hospice care. We are going to add a Palliative Care consult for him as far as Hospice and evaluate the possibility of sending him home with Hospice.   At this moment, the patient is going to be admitted under observation.     TIME SPENT: I spent about 60 minutes trying to get all the information and trying to contact family.   ____________________________ Felipa Furnace, MD rsg:cb D: 06/13/2012 19:27:32 ET T: 06/13/2012 20:03:06 ET JOB#: [id number]  cc: Felipa Furnace, MD, <Dictator> Colan Laymon Juanda Chance MD ELECTRONICALLY SIGNED 06/25/2012 1:44

## 2014-05-09 NOTE — Discharge Summary (Signed)
PATIENT NAME:  Gabriel Avila, [name] MR#:  [id number] DATE OF BIRTH:  1940-02-09  DATE OF ADMISSION:  06/14/2012 DATE OF DISCHARGE:  06/15/2012  ADDENDUM  It is also noted the patient has a history of hypertension.  He was not taking any medications.  We did also discharge him with Norvasc 10 mg daily.  He will need to follow up with his primary care physician for his blood pressure.    ____________________________ Ragan Duhon P. Juliene PinaMody, MD spm:ea D: 06/15/2012 15:38:56 ET T: 06/16/2012 04:19:12 ET JOB#: [id number]  cc: Lily Kernen P. Juliene PinaMody, MD, <Dictator> Sandeep R. Sherrlyn HockPandit, MD Janyth ContesSITAL P Alandis Bluemel MD ELECTRONICALLY SIGNED 06/16/2012 14:18

## 2014-05-09 NOTE — Consult Note (Signed)
PATIENT NAME:  Gabriel Avila, Gabriel L MR#:  [id number] DATE OF BIRTH:  10/01/1940  DATE OF CONSULTATION:  07/14/2012  CONSULTING PHYSICIAN:  Marylen Zuk R. Sherrlyn HockPandit, MD  REFERRING PHYSICIAN:  Dr. Nemiah CommanderKalisetti  REASON FOR CONSULTATION:  Lung cancer with pneumonia and COPD.   HISTORY OF PRESENT ILLNESS:  The patient is a 74 year old gentleman with past medical history of chronic smoking, history of COPD, prior history of stomach cancer, history of left lung cancer, status post [facility], with subsequent right lung nodule, which was followed up until recently, when CT scan of the chest during recent admission showed progressive mediastinal adenopathy, GERD, mild dementia, hypertension, hypothyroidism, chronic right-sided hydronephrosis, chronic alcohol intake, SVT, GI bleeding in the past, chronic anemia, ischemic cardiomyopathy with EF of 25%, esophagitis. The patient had PET scan done on 06/15/2012, which showed significant areas of abnormality in the mediastinum, right chest wall, and right supraclavicular region, consistent with recurrent malignancy. He was given appointment for a CT-guided biopsy of the right lower rib mass as outpatient, and to [facility] back on June [facility], but he failed to show up for both appointments. He is currently admitted in hospital for progressive shortness of breath with wheezing, likely COPD exacerbation. He denies any new pain issues or hemoptysis. Denies any fevers or chills. Oral intake is fair. He has lost some weight recently.   PAST MEDICAL HISTORY/SURGICAL HISTORY:  As in HPI above.   ALLERGIES:  No known drug allergies.   FAMILY HISTORY:  Remarkable for heart disease, diabetes.   SOCIAL HISTORY:  Chronic smoker, states that he has quit months ago. History of regular alcohol intake, states that he has markedly decreased this many months now. Denies recreational drug usage. Ambulates slowly, given dyspnea on exertion symptoms.   HOME  MEDICATIONS:  Amlodipine 10 mg daily. Aspirin 81 mg daily, takes p.r.n. Cardizem CD 120 mg extended release 1 daily. Guaifenesin AC 5 to 10 mL q. 4 hours p.r.n. for cough. Omeprazole 20 mg b.i.d. Tessalon Perles 100 mg t.i.d. p.r.n. for cough.   REVIEW OF SYSTEMS:  GENERAL:  The patient is weak, dyspnea on exertion. Denies fevers or chills. Denies night sweats.  HEENT:  Denies any headaches, dizziness at rest. No epistaxis, ear or jaw pain.  CARDIAC:  Denies angina, palpitation, orthopnea or PND.  LUNGS:  As in HPI.   GASTROINTESTINAL:  No nausea, vomiting or diarrhea. Denies bright red blood in stools or melena.  GENITOURINARY:  No dysuria or hematuria.  MUSCULOSKELETAL:  No new bone pains, states that he has some chronic arthritis.  SKIN:  No new rashes or pruritus.  HEMATOLOGIC:  Denies bleeding symptoms.  EXTREMITIES:  No new swelling or pain.  NEUROLOGIC: Denies new focal weakness, seizures or loss of consciousness. No new headaches or falls.  ENDOCRINE:  No polyuria or polydipsia. Appetite is fair.   PHYSICAL EXAMINATION: GENERAL:  Patient is weak and frail-looking, mildly tachypneic on speaking, with some audible wheeze. Otherwise no acute distress, alert and oriented, and converses appropriately. No icterus. Mild pallor.  VITAL SIGNS:  97.6, 69, 20, 111/69, 99% on 2 liters oxygen.  HEENT:  Normocephalic, atraumatic. Extraocular movements intact. Sclerae anicteric. Mouth is dry. No thrush.  NECK:  Negative for lymphadenopathy.  CARDIOVASCULAR:  S1, S2. Regular rate and rhythm.  LUNGS:  Show markedly diminished bilateral breath sounds overall, bilateral rhonchi present. No crepitations.  ABDOMEN:  Soft. Liver just palpable, no splenomegaly or other masses palpable clinically. Bowel  sounds present.  EXTREMITIES:  Show no major edema or cyanosis.  SKIN:  Shows no generalized rashes or major bruising.  NEUROLOGIC:  Limited exam. Cranial nerves intact, moves all extremities spontaneously.   MUSCULOSKELETAL:  No obvious joint deformity or swelling.   LAB RESULTS: WBC 5000, ANC 4300, hemoglobin 12.2, platelets 184. Creatinine 1.0, sodium 128, calcium 9.3, potassium 4.2. TSH normal at 1.86. CK-MB normal at 0.5. Repeat CT of the chest without contrast done earlier today shows worsening appearance of the inferior aspect of the right upper lobe and right middle lobe, consistent with developing interstitial pneumonia, no suspicious masses within the lung, previous left pneumonectomy finding, soft tissue fullness in the right hilar region consistent with lymphadenopathy.   IMPRESSION AND RECOMMENDATION:  A 74 year old gentleman with history of multiple medical problems as described above, including prior history of left lung cancer, and more recently found to have abnormal PET scan, with lesions in the mediastinum, right chest wall and right supraclavicular region. The patient had already been scheduled for CT-guided biopsy of right lower rib mass as outpatient, but he failed to show for appointment, to try and get tissue diagnosis of metastatic malignancy, following which he was going to decide if he would take treatment. I have again discussed in detail about recent PET scan findings with patient, and if he is interested in pursuing biopsy procedures to get tissue diagnosis. The patient states that he does want to get biopsy done. Accordingly, will request this as inpatient on Monday, June 30 to be done by Radiology if his condition remains stable. Will then follow up and make further recommendations, based upon biopsy result. The patient otherwise does not have any acute pain issues or hemoptysis at this time. Will continue to follow. The patient was explained above, agreeable to this plan.   Thank you for the referral. Please feel free to contact me if any additional questions.    ____________________________ Maren Reamer Sherrlyn Hock, MD srp:mr D: 07/14/2012 18:19:57 ET T: 07/14/2012 18:57:22  ET JOB#: [id number]  cc: Kendra Grissett R. Sherrlyn Hock, MD, <Dictator> Wille Celeste MD ELECTRONICALLY SIGNED 07/16/2012 17:15

## 2014-05-09 NOTE — H&P (Signed)
PATIENT NAME:  Gabriel Avila, Gabriel Avila MR#:  [id number] DATE OF BIRTH:  1940-12-19  DATE OF ADMISSION:  07/13/2012  PRIMARY CARE PROVIDER: Leia Alf, MD  CHIEF COMPLAINT: Shortness of breath.   HISTORY OF PRESENT ILLNESS: The patient is a 74 year old African American male with history of lung cancer, metastatic stage IV, who also has COPD, hypertension and hypothyroidism who recently was hospitalized with bloody vomitus. Had an EGD. No source bleed was identified. The patient was discharged home in stable condition. He presents back today with complaint of shortness of breath. He reports that the shortness of breath occurs with activity, but is worse with lying down. He does not have any lower extremity swelling. He has also been having a lot of coughing, wheezing. Has not had any fevers or chills. He is not on any oxygen at home. He does not have any chest pains or palpitations. He has a cough, but that is nonproductive. He denies any abdominal pain, nausea, vomiting or diarrhea. Denies any further bleeding or coughing up or throwing up blood. Denies any blood in the stool.  PAST MEDICAL HISTORY:  1.  Lung cancer, metastatic stage IV. He has decided not to undergo chemotherapy.  2.  COPD.  3.  Stomach cancer.  4.  GERD. 5.  Hypertension.  6.  Dementia.  7.  Hypothyroidism. 8.  Failure to thrive.  9.  Status post left pneumonectomy.  10.  Ischemic cardiomyopathy with EF of 25%.  11.  GI bleeding in the past.  12.  Chronic right-sided hydronephrosis.  13.  Continued tobacco use.  14.  Protein caloric malnutrition.  15.  Chronic anemia.   ALLERGIES: No known drug allergies.  HOME MEDICATIONS: The patient is only on: 1.  Cardizem CD 120 mg daily.  2.  Omeprazole 20 mg 1 tab p.o. b.i.d. 3.  Tessalon Perles 1 cap 3 times a day as needed for cough.   SOCIAL HISTORY: Continues to smoke less than a pack a day. Denies any alcohol now, used to drink. Lives with his daughter.   FAMILY HISTORY:  Positive for diabetes in parents, positive for coronary artery disease.   REVIEW OF SYSTEMS: CONSTITUTIONAL: Denies any fevers. Complains of fatigue, weakness. No significant pain. No weight loss. No weight gain.  EYES: No blurred or double vision. No pain. No redness. No inflammation. No glaucoma. No cataracts.  ENT: No tinnitus. No ear pain. No hearing loss. No seasonal or year-round allergies.  RESPIRATORY: Complains of cough, nonproductive. Complains of wheezing. No hemoptysis. Complains of dyspnea.  Has COPD.  Has lung cancer.  CARDIOVASCULAR: Denies any chest pain, orthopnea or edema. Has a history of ischemic cardiomyopathy. Denies palpitations, syncope.  GASTROINTESTINAL: No nausea, vomiting or diarrhea. No abdominal pain. No hematemesis. No melena. No ulcers.  Has history of GERD.  GENITOURINARY: Denies any dysuria, hematuria, renal calculus or frequency. ENDOCRINE:  Denies any polyuria, nocturia or thyroid problems.  HEMATOLOGIC AND LYMPHATIC: Has chronic anemia.  Denies easy bruisability or bleeding.  MUSCULOSKELETAL: Denies any pain in the neck, back or shoulders. NEUROLOGIC:  No numbness. No CVA. No TIA. No seizures.  PSYCHIATRIC: No anxiety. No insomnia. No ADD.   PHYSICAL EXAMINATION: VITAL SIGNS: Temperature 98.5, pulse 101, respirations 24, blood pressure 138/80 and O2 88% on room air.  GENERAL: The patient is thin African American male in no acute distress.  HEENT: Head:  Normocephalic, atraumatic. Eyes: Pupils equally round and reactive to light and accommodation. No conjunctival pallor. No scleral icterus. Extraocular movements  intact. Nose:  No nasal lesions. No drainage. Ears:  There is no drainage or external lesions. Mouth:  There is no exudate.  NECK: Supple, symmetric. No masses. Thyroid midline, not enlarged.  LUNGS:  He has bilateral wheezes throughout the lungs with occasional rhonchi. There is no accessory muscle usage.  HEART:  Regular rate and rhythm. No murmurs,  rubs, clicks or gallops. PMI is not displaced.  GENITOURINARY: Deferred.  ABDOMEN:  No mass. Positive bowel sounds x 4. No hepatosplenomegaly.  MUSCULOSKELETAL: There is no erythema or swelling.  SKIN: There is no rash or any other lesions. LYMPHATIC: No lymph nodes palpable.  VASCULAR: Good DP and PT pulses.  NEUROLOGICAL: Cranial nerves II through XII grossly intact. Strength is 5/5.  PSYCHIATRIC: Not anxious or depressed.   LABORATORY AND RADIOLOGICAL DATA: Chest x-ray shows complete opacification of most of the left lung. He has right midlung increased markings suggestive of possible congestive heart failure.   Glucose 120, BUN 8, creatinine 0.83, sodium 129, potassium 3.8, chloride 96, CO2 24, calcium 9.1. LFTs: Total protein 9.4, albumin 2.9, bili total 0.6, alk phos 92, ALT 10. Troponin less than 0.02. WBC 6.4, hemoglobin 13.4 and platelet count 175.   ASSESSMENT AND PLAN: The patient is a 74 year old African American male with stage IV lung cancer with recent gastrointestinal bleed, chronic obstructive pulmonary disease and hypertension who presents with shortness of breath.  1.  Acute respiratory failure, not on oxygen at home, presents with shortness of breath, orthopnea. It is likely combination of acute systolic congestive heart failure and chronic obstructive pulmonary disease exacerbation. At this time, we will treat him with IV Solu-Medrol q. 6, IV Levaquin. We will place him on nebs around-the-clock as well as low-dose IV Lasix. The patient is currently not on any treatment for chronic obstructive pulmonary disease. We will start him on Advair and Spiriva.  2.  Lung cancer, stage IV.  Prognosis poor. He is DNR.  3.  Gastroesophageal reflux disease with recent gastrointestinal bleed. We will continue omeprazole.  4.  History of hypertension.  On Cardizem, which we will continue.  5.  Failure to thrive.  6.  Severe caloric protein malnutrition.  7.  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  Could be related to fluid overload as well. At this time, we will monitor sodium.  His sodium on discharge last time was 131.   CODE STATUS: The patient is DNR.  According to the patient's daughter, they have misplaced the yellow DNR form so will need to be refilled, but they do confirm DNR status.  TIME SPENT: 50 minutes was spent on the H and P.  ____________________________ Lafonda Mosses. Posey Pronto, MD shp:sb D: 07/13/2012 14:21:26 ET T: 07/13/2012 14:46:05 ET JOB#: [id number]  cc: Sonali Wivell H. Posey Pronto, MD, <Dictator> Alric Seton MD ELECTRONICALLY SIGNED 07/31/2012 10:53

## 2014-05-09 NOTE — H&P (Signed)
PATIENT NAME:  Gabriel Avila, [name]:  [id number] DATE OF BIRTH:  [birth year]  DATE OF ADMISSION:  06/20/2012  PRIMARY ONCOLOGIST: Dr. Sherrlyn HockPandit.   CHIEF COMPLAINT: Bloody vomiting.   HISTORY OF PRESENT ILLNESS: Mr. Gabriel Avila is a 74 year old African American male with past medical history of hypertension, hypothyroidism, a recent a diagnosis of recurrence of stage IV lung CA, recommended no treatment considering the extent of metastatic disease. Also the patient has a history of stomach cancer in the past. Comes to the Emergency Department with  3 to 4 episodes of bloody vomiting.   The patient started to experience mild abdominal pain and had 2 to 3 episodes of vomiting a large amount, dark brownish to red color. Concerning this, EMS was called and he was brought to the Emergency Department. The patient's hemoglobin somewhat remained stable before the IV fluids. The patient was given IV Protonix, and currently on drip. The patient denies having any abdominal pain. Last bowel movement was yesterday, which was dark brown colored. The patient was recently admitted in the end of May 2014 for failure to thrive. At that time, the patient was found to have metastatic lung CA. The patient states he has a slightly improved appetite, however, has not been gaining weight.   PAST MEDICAL HISTORY: 1.  Lung CA, metastatic stage IV: The patient decided not to undergo any IV chemotherapy.  2.  COPD.  3.  History of stomach cancer.  4.  Gastroesophageal reflux disease.  5.  Hypertension.  6.  Dementia.  7.  Hypothyroidism.  8.  Failure to thrive.  9.  Left pneumonectomy.  10.  Ischemic cardiomyopathy, with EF of 25%.  11.  History of gastroesophageal reflux.   12.  History of GI bleed in the past.  13.  Chronic right-sided hydronephrosis.  14.  Continued tobacco use.  15.  Continued alcohol use.  16.  Protein-calorie malnutrition.  17.  Chronic anemia.   ALLERGIES: No known drug allergies.   HOME  MEDICATIONS: Norvasc, daily.   SOCIAL HISTORY: Continues to smoke a about pack a day. Continues to drink alcohol. Patient could not specify the amount. Lives with his daughter. Has poor functional status at baseline.   FAMILY HISTORY: Positive for diabetes mellitus in parents, and positive for coronary artery disease.   REVIEW OF SYSTEMS: The patient has underlying dementia, however is able to answer some of the questions with the help of the patient's daughter.   CONSTITUTIONAL: Generalized fatigue. Weight loss.  EYES: No change in vision.  ENT: No change in hearing. No tinnitus.  RESPIRATORY: No cough, shortness of breath.  CARDIOVASCULAR: No chest pain, palpitations.  GASTROINTESTINAL: Nausea, vomiting, abdominal pain.  GENITOURINARY: No dysuria or hematochezia.  SKIN: No rash or lesions.  HEMATOLOGIC: No easy bruising or bleeding.  MUSCULOSKELETAL: Has generalized body aches, however does not take any medicines.  NEUROLOGIC: No weakness or numbness.   PHYSICAL EXAMINATION: GENERAL: This is a cachectic-appearing male laying down in the bed, not in distress.  VITAL SIGNS: Temperature 98.1, pulse 84, blood pressure 119/78, respiratory rate of 16, oxygen saturation is 99% on room air.  HEENT: Head normocephalic, atraumatic. No scleral icterus. Conjunctivae normal. Pupils equal and reactive to light. Extraocular movements are intact. Mucous membranes moist. No pharyngeal erythema.  NECK: Supple. No lymphadenopathy. No JVD. No carotid bruit.  CHEST: Has no focal tenderness.  LUNGS: Bilaterally clear to auscultation.   HEART: S1 and S2 regular. No murmurs are heard. No pedal edema. Pulses 2+.  ABDOMEN: Bowel sounds present. Soft. Mild tenderness in the epigastric area. No rebound or guarding. Previous surgical scar on the mid-abdomen.  SKIN: No rash or lesions.   MUSCULOSKELETAL: Good range of motion in all the extremities.  NEUROLOGIC: The patient is alert, oriented to place, person and,  not to time. Cranial nerves II-XII intact. Motor 5/5 in upper and lower extremities, but not to sensory.   LABS: CBC: WBC of 15.6, hemoglobin 10.9, platelet count of 229.   CMP: Sodium 131, BUN 16, creatinine of 1.39. The rest of all the values are within normal limits.   ASSESSMENT AND PLAN: Mr. Kurka is a 74 year old male who comes to the Emergency Department with vomiting of blood.   1.  Gastrointestinal bleed: Upper GI; the differential diagnosis, peptic ulcer disease, gastritis  secondary to alcohol use or recurrent epigastric CA. Admit the patient to the step-down unit of the ICU. The patient is currently hemodynamically stable, however will need to have continuous monitoring considering the patient's recurrent episodes of coffee-ground emesis. Continue to follow up CBC. Continue with IV fluids. Will consult GI for EGD. Continue the Protonix drip. 2.  Recurrence of stage IV lung CA: The patient has    oncology recommended any treatment. The patient currently does not have any symptoms.  3.  Failure to thrive: Per family, the patient has been eating but had a PEG tube before, however continues to lose weight.  4.  Hypertension: Currently well-controlled. Hold the home medication of Norvasc.  5.  Continued tobacco and alcohol use: The patient wants to continue to smoke and drink.  6.  Keep the patient on DVT prophylaxis with SCDs    ____________________________ Susa Griffins, MD pv:dm D: 06/20/2012 07:10:53 ET T: 06/20/2012 07:29:11 ET JOB#: [id number]  cc: Susa Griffins, MD, <Dictator> Susa Griffins MD ELECTRONICALLY SIGNED 06/24/2012 19:42

## 2014-05-09 NOTE — Consult Note (Signed)
PATIENT NAME:  Gabriel Avila, Gabriel Avila MR#:  [id number] DATE OF BIRTH:  [birth year]  DATE OF CONSULTATION:  06/14/2012  REFERRING PHYSICIAN:  Dr. Berlinda LastSanchez Gutierrez.  CONSULTING PHYSICIAN:  Bernice Mullin R. Sherrlyn HockPandit, MD.  REASON FOR CONSULTATION: Patient with failure to thrive, CT chest shows multiple mediastinal adenopathy with history of lung cancer.   HISTORY OF PRESENT ILLNESS: The patient is a 74 year old gentleman with a past medical history significant for history of left lung cancer status post [facility], also history of right lung nodule which has been on followup with serial CT scans, COPD, prior history of stomach cancer, hypertension, hypothyroidism, mild dementia, GERD. The patient was last seen at cancer center in September 2013. More recently, he has had fairly good appetite but unintentional weight loss of about 15 pounds, progressive dyspnea on exertion with cough and was also found to have some hypoxia with fluctuating heart rate. The patient, therefore, was admitted to the hospital for failure to thrive and mild dehydration. States that he has cut down on alcohol significantly for the last 6 to 8 months. He denies any new bone pains. No chest pain or hemoptysis. The patient and family are concerned about recurrent lung cancer. CT scan of the chest shows progressive mediastinal adenopathy.   PAST MEDICAL HISTORY AND SURGICAL HISTORY: As in HPI above. In addition history of ischemic cardiomyopathy with EF of 25%, esophagitis, GI bleeding in the past, SVT, chronic right-sided hydronephrosis, chronic alcohol abuse, chronic anemia.   ALLERGIES: No known drug allergies.   FAMILY HISTORY: Remarkable for diabetes and heart disease.   SOCIAL HISTORY: Chronic smoker, several cigarettes per day. History of regular alcohol intake; states that he has decreased this in the last 6 to 8 months. Has been ambulating slowly and tries to remain active.   MEDICATIONS: Not on regular home  medications.   REVIEW OF SYSTEMS:  CONSTITUTIONAL: Generalized weakness, progressive. No fevers or night sweats.  HEENT: Denies any headaches or dizziness at rest. No epistaxis, ear or jaw pain.  CARDIAC: No angina, palpitation, orthopnea or PND.   LUNGS: As in HPI above.  GASTROINTESTINAL: No nausea, vomiting or diarrhea. No new constipation. No bright red blood in stools or melena.  GENITOURINARY: No dysuria or hematuria.  SKIN: No new rashes or pruritus.  MUSCULOSKELETAL: Has some arthritis, denies new bone pains.  NEUROLOGIC: No new focal weakness, seizures or loss of consciousness.  ENDOCRINE: No polyuria or polydipsia. Recent unintentional weight loss.   PHYSICAL EXAMINATION:  GENERAL: The patient is frail and thin individual, sitting in bed, oriented to self, place and person. Otherwise poor historian. No acute distress.  VITAL SIGNS: 97.4, 94, 18, 131/78, 96% on room air.  HEENT: Normocephalic, atraumatic. Extraocular movements intact. Sclerae anicteric.  NECK: Negative for lymphadenopathy.  CARDIOVASCULAR: S1, S2, regular rate and rhythm.  LUNGS: Markedly diminished breath sounds on the left side, status post left pneumonectomy in the past. Diminished breath sounds on the right side but no crepitations or rhonchi.  ABDOMEN: Soft, nontender. No hepatosplenomegaly clinically.  EXTREMITIES: No major edema or cyanosis.  SKIN: No generalized rashes or ecchymosis.  NEUROLOGIC: Limited exam. Cranial nerves intact. Moves all extremities spontaneously.  MUSCULOSKELETAL: No obvious joint deformity or swelling.   LABORATORY DATA: WBC 3700, ANC 2100, hemoglobin 11.3, platelets 170, MCV 87. Creatinine 1.12, potassium 3.6, calcium 8.2. LFT from yesterday shows low ALT of 8, low albumin of 2.8. Otherwise unremarkable.   IMPRESSION AND RECOMMENDATIONS: A 74 year old gentleman admitted with failure to  thrive, progressive cough and dyspnea, dehydration who has known prior history of left-sided  non-small cell lung cancer status post pneumonectomy in 1997 and history of right lung nodule. CT scan of the chest done yesterday shows multiple enlarged right paratracheal and precarinal lymph nodes measuring up to 2.1 cm, enlarged subcarinal lymph node, enlarged right hilar lymph nodes causing mild narrowing of right upper lobe pulmonary arteries. These are concerning for possible recurrence of lung cancer with metastasis to lymph nodes. The patient and family explained about findings on CT scan and discussed with them if he would be interested in pursuing further investigation and treatment. The patient does want to pursue this and states that he would also possibly consider taking treatment. Plan, therefore, is to get PET scan for further evaluation for recurrent lung cancer and evaluate for distant sites of metastatic disease. Agree with ongoing supportive treatment. He does not have any major pain issues or hemoptysis at this time. Will follow up after PET scan and make further recommendations. Will likely need to pursue biopsy if PET scan also is abnormal. The patient and family are agreeable to this plan.   Thank you for the referral. Please feel free to contact me for additional questions.    ____________________________ Maren Reamer Sherrlyn Hock, MD srp:gb D: 06/15/2012 01:05:01 ET T: 06/15/2012 01:31:03 ET JOB#: [id number]  cc: Annarose Ouellet R. Sherrlyn Hock, MD, <Dictator> Wille Celeste MD ELECTRONICALLY SIGNED 06/15/2012 12:12

## 2014-05-09 NOTE — Consult Note (Signed)
PATIENT NAME:  Gabriel Avila, Gabriel Avila MR#:  [id number] DATE OF BIRTH:  24-Oct-1940  DATE OF CONSULTATION:  06/20/2012  ATTENDING:  Dr. Bobbye Charleston  CONSULTING PHYSICIAN:  Payton Emerald, NP  REASON FOR CONSULTATION: Hematemesis.  REQUESTING PHYSICIAN:  Dr. Myriam Jacobson, GI as well as Dr. Verdie Shire.   HISTORY OF PRESENT ILLNESS: The patient is a 74 year old African American gentleman with significant past medical history of lung cancer status post pneumonectomy at [facility] in 1997, right lung nodule which had been followed up at the [facility] under the care of the oncologist there up until approximately a year ago. In reviewing Dr. Beverly Gust hospital note on  06/14/2012, the last time in which the patient was admitted, he was admitted for failure to thrive, a progressive cough, dyspnea, dehydration. It is noted that he had a CT scan of the chest done which showed multiple enlarged right peritracheal and paracarinal lymph nodes measuring up to 2.1 cm and large subcarinal lymph node, enlarged right hilar lymph nodes causing mild narrowing of the right upper lobe pulmonary artery. A concern existed for possible recurrence of lung cancer with metastatic involvement of the lymph nodes. Dr. Ma Hillock states that the patient and the family were explained of these findings, but did not wish to proceed with any further investigation or treatment at that point in time. He presented to [facility] Emergency Room yesterday for the concern of hematemesis. He had vomited 2 to 3 episodes of dark black-colored liquid at home a day prior. Based on these findings and starting to experience midabdominal pain which was documented on yesterday's H and P though he denies any pain today.  EMS was notified and presented to the Emergency Room for evaluation. He denies any vomiting or nausea of other occurrences recently other than what is stated, afebrile. No shortness of breath, chest pain. No dizziness or edema to lower extremities. Weight loss, but he is  unable to elaborate the amount. His bowel movement pattern is twice a day, last bowel movement was yesterday. Denies any aspirin, BC, Goody powder or NSAID use. He states he has a known history of gastric ulcers and actually disputes the history of stomach cancer and stated that he thought that it was ulcers in which he has had. Reviewed EMR of 12/29/2008. He had an upper endoscopy performed by Dr. Dionne Milo, which revealed evidence of LA grade C erosive esophagitis, normal stomach, normal examined duodenum.  Reviewing his home medications, he is on PPI therapy at home. Only medication which is listed is Norvasc.   PAST MEDICAL HISTORY: History of ischemic cardiomyopathy with an ejection fraction of 25%. Reflux esophagitis LA grade C, GI bleeding in the past suspected related to gastric ulcers, SVT, chronic right-sided hydronephrosis. Chronic alcohol abuse with continued alcohol use on a weekly basis. Chronic anemia and then history of lung cancer status post pneumonectomy at [facility] in 1997, a history of right lung nodule which had been monitored. CT scan of the chest revealing evidence of lymphadenopathy with suspected recurrence of a cancer with metastatic involvement to lymph nodes. Chronic obstructive pulmonary disease, hypertension, hypothyroidism. Mild dementia and GERD.   PAST SURGICAL HISTORY: Lung surgery, pneumonectomy and then gastrectomy.   FAMILY HISTORY: No family history of colon cancer or other neoplasms. Significant for diabetes and heart disease.   SOCIAL HISTORY: Chronic smoker, smokes several cigarettes a day.  A history of heavier alcohol use for numerous years, currently drinking 1 pint a week and no recreational drug use.   HOME  MEDICATIONS: Norvasc, unknown milligram, once daily.   ALLERGIES: None.   REVIEW OF SYSTEMS:  CONSTITUTIONAL: Significant for generalized weakness, which has progressively become more significant, for weight loss unable to elaborate exact amount. No  fevers. No night sweats.   ENT: No headaches, dizziness. No epistaxis, sore throat, ear pain.   CARDIOVASCULAR: Denies any chest pain, shortness of breath.   PULMONARY: Significant for shortness of breath at times and significant history of metastatic lung disease.   GI: See history of present illness.   GENITOURINARY: Denies any dysuria or hematuria.   SKIN: No new rashes, jaundice.   MUSCULOSKELETAL: Significant for mild neuralgias. No contractures. No clubbing.   NEUROLOGICAL: No history of cerebrovascular accident or transient ischemic attack. Mild dementia.   ENDOCRINE: No polyuria, polydipsia.   PHYSICAL EXAMINATION: VITAL SIGNS: Temperature 98.3 with a pulse of 72, respirations 20, blood pressure 143/81 with a pulse oximetry 96% on room air.   GENERAL: Well developed, slender, 74 year old, African American gentleman, no acute distress noted. Appears to be resting comfortably.   HEENT: Normocephalic, atraumatic. Pupils equal, reactive to light evidence of cataracts bilaterally.   NECK: Supple. Trachea midline.    PULMONARY: Symmetric rise and fall of chest. Decreased breath sounds entire left side. No adventitious sounds otherwise.  Diminished breath sounds on the right side.  CARDIOVASCULAR: Regular rhythm, S1, S2.   ABDOMEN: Flat, nondistended. No evidence of hepatosplenomegaly. No bruits. No masses. Nontender. Slightly hypoactive bowel sounds.   RECTAL: Deferred.   MUSCULOSKELETAL: Movement of all four extremities, no contractures. No clubbing.   EXTREMITIES: No edema.   PSYCH: Alert, oriented x 3. A slight delay in memory recall, but overall alert and oriented.   NEUROLOGICAL: There are no gross neurological deficits.   LABORATORY, DIAGNOSTIC, AND RADIOLOGICAL DATA:  Glucose of 120 today with a creatinine of 1.39, chloride is 97, EGFR is 58. Calcium is 9.2. Hepatic panel: Total protein 8.9 with an albumin of 2.9, ALT 8, otherwise within normal limits. CK total  38. CK-MB 0.6. Troponin less than 0.02. CBC, on admission to the Emergency Room yesterday, hemoglobin 13.2, hematocrit 39.3 with serial trending every 8 hours. Hemoglobin dropped to 12.9 and hematocrit 38.0 and today at approximately 9:30 this morning, hemoglobin 11.4 with hematocrit of 34.2. PT 12.7, INR 0.9, PTT 29.2. Urinalysis essentially within normal limits. EKG shows normal sinus rhythm. Chest x-ray revealed near total atelectasis of the left lung may be slightly progressed since the previous study, increased overall density in the left hemithorax. Right lung is hyperinflated and clear.   IMPRESSION: 1. Hematemesis, history of lung cancer as well as stomach cancer metastatic involvement per Dr. Beverly Gust notes of 06/14/2012.   2. Anemia.  3. Alcohol abuse.   PLAN: The patient's presentation was discussed with Dr. Verdie Shire. Will advance diet for this evening to clear liquid n.p.o. after midnight with a goal to proceed with EGD tomorrow to allow the left allow direct luminal evaluation of upper GI tract and assess for source of possible blood loss. Order placed. We will continue to monitor laboratory as well as hemodynamic status.   These services provided by Payton Emerald, MS, APRN, [facility], FNP, under collaborative agreement with Dr. Verdie Shire.   ____________________________ Payton Emerald, NP dsh:rw D: 06/20/2012 15:18:31 ET T: 06/20/2012 16:49:48 ET JOB#: [id number]  cc: Payton Emerald, NP, <Dictator> Payton Emerald MD ELECTRONICALLY SIGNED 06/22/2012 15:45

## 2014-05-09 NOTE — Consult Note (Signed)
EGD showed a tight esophageal stricture. No actvie bleeding. Had to be dilated with balloon to pass scope. Order protonix bid. May need to dilate further in the future if patient can tolerate the procedure. Will sign off. thanks.  Electronic Signatures: Lutricia Feilh, Lutisha Knoche (MD)  (Signed on 05-Jun-14 15:52)  Authored  Last Updated: 05-Jun-14 15:52 by Lutricia Feilh, Jumaane Weatherford (MD)

## 2014-05-09 NOTE — Discharge Summary (Signed)
PATIENT NAME:  Gabriel Avila, Gabriel Avila MR#:  [id number] DATE OF BIRTH:  1940-07-04  DATE OF ADMISSION:  06/14/2012 DATE OF DISCHARGE:  06/15/2012  ADMISSION DIAGNOSIS: Weakness.   DISCHARGE DIAGNOSES:  1. Generalized weakness, likely secondary to dehydration, failure to thrive.  2. Abnormal TSH, normal T4.  3. Lung nodules and lymphadenopathy.  4. Hypomagnesemia.  5. Hyponatremia, improved.  6. History of chronic obstructive pulmonary disease.  7. Hypertension.  8. History of ischemic cardiomyopathy, ejection fraction of 25%.   CONSULTS:  1. Palliative care.  2. Dr. Sherrlyn HockPandit.   LABORATORIES AT DISCHARGE: Sodium 135, potassium 3.6, chloride 105, bicarb 27, BUN 10, creatinine 0.96, glucose 87. TSH 8.99. Free 4 is 1.35.   PET scan is pending.   HISTORY OF PRESENT ILLNESS: A 74 year old male who was brought in by his family for failure to thrive and weakness. For further details, please refer to the H and P.  1. Generalized weakness due to dehydration, poor p.o. intake with failure to thrive. His creatinine has improved with IV fluids, so this is suspected to be secondary to dehydration. The patient was feeling better after IV fluids and is able to ambulate without any difficulties and does not need any home health care or PT.  2. Abnormal TSH. His T4 is normal. He will need to have followup for this. I suspect this is secondary to sick euthyroid from underlying lung processes.  3. Lung nodules and lymphadenopathy. The patient had a CT scan which was concerning for multiple enlarged mediastinal and right hilar lymph nodes concerning for malignancy. Therefore, oncology was consulted. They recommended PET scan. At this time, PET scan results are pending. I spoke with Dr. Sherrlyn HockPandit who recommended, if the patient is to be discharged, then follow up with Dr. Sherrlyn HockPandit on Monday or Tuesday for PET scan results as well as plan of care.  4. Hypomagnesemia which is probably contributing to his generalized weakness  which was repleted. He will be discharged home with a few days of magnesium supplements.  5. Mild hyponatremia from dehydration which improved with IV fluids.  6. COPD which is stable.  7. Hypertension. His blood pressure is stable.  8. History of cardiomyopathy, EF of 25%. This was asymptomatic.   DISCHARGE MEDICATIONS:  1. Fluticasone/salmeterol 250/50 one puff b.i.d.  2. Docusate 100 mg b.i.d. p.r.n. constipation.  3. Magnesium oxide 400 mg at bedtime x5 days.   DISCHARGE DIET: Regular.   DISCHARGE SUPPLEMENT: Ensure daily.   DISCHARGE FOLLOWUP: The patient will follow up with Dr. Sherrlyn HockPandit next week for PET scan results.   TIME SPENT: Approximately 35 minutes.    ____________________________ Janyth ContesSital P. Juliene PinaMody, MD spm:gb D: 06/15/2012 15:32:09 ET T: 06/15/2012 22:43:43 ET JOB#: [id number]  cc: Samaad Hashem P. Juliene PinaMody, MD, <Dictator> Sandeep R. Sherrlyn HockPandit, MD Janyth ContesSITAL P Angeline Trick MD ELECTRONICALLY SIGNED 06/16/2012 14:18

## 2014-05-09 NOTE — Discharge Summary (Signed)
PATIENT NAME:  Gabriel Avila, Gabriel Avila MR#:  [id number] DATE OF BIRTH:  Oct 11, 1940  DATE OF ADMISSION:  07/13/2012  DATE OF DISCHARGE:  07/19/2012  ADMITTING DIAGNOSIS: Acute respiratory failure.  DISCHARGE DIAGNOSES:   1.  Acute respiratory failure.  2.  Chronic obstructive pulmonary disease exacerbation. 3.  Bilateral  pneumonia.  4.  Stage IV lung carcinoma. 5.  Status post CT-guided biopsy of right rib mass on 07/17/2012 consistent with carcinoma with immunostains pending.  6.  History of left pneumonectomy.  7. History of gastroesophageal reflux disease, hypertension, history of ischemic cardiomyopathy, with ejection fraction of 25%. 8.  Hyponatremia, multifactorial likely.  9. History of stomach cancer, hypertension, dementia, hypothyroidism, failure to thrive, gastrointestinal bleed in the past, chronic right-sided hydronephrosis, continued tobacco abuse, protein calorie malnutrition, and chronic anemia.   DISCHARGE CONDITION:  Poor.  DISCHARGE MEDICATIONS:  1.  The patient is being discharged on 4 liters of oxygen through nasal cannula.  2.  Morphine 10 mg/mL oral concentrate 0.25 mL every 1 to 2 hours as needed.  3.  Zofran ODT 4 mg 1 tablet every 6 hours as needed.  4.  Alprazolam 0.25 mg 3 times daily as needed.   INSTRUCTIONS:  The patient should have Foley catheter placed for urinary incontinence for prevention of skin breakdown. Diet as tolerated. Activity as tolerated. The patient is being discharged to hospice home.   CONSULTANTS:  Dr. Sherrlyn Hock,  Care Management, Palliative Care.   RADIOLOGIC STUDIES: Chest one-way x-ray, 07/13/2012, showed persistent increased density in the left hemithorax. There is essentially no normal aeration of left lung. Patchy increased density in the right lung base was also noted.   CT scan of chest without contrast on 06/24/2012 showed deterioration in the appearance of the inferior aspect of right upper lobe and right middle lobe, consistent with  developing interstitial pneumonia. No suspicious masses within the lung, but there are scattered areas of nodular density noted in the right lung. The patient may have undergone previous left pneumonectomy. No aerated left lung is demonstrated. There is soft tissue fullness in the right hilar region consistent with lymphadenopathy, which has not significantly changed since previous study, according to radiologist.   CT-guided biopsy, 07/17/2012:  Successful right rib biopsy, with preliminary results positive for malignancy, according to radiologist.   Chest, portable single, 07/18/2012, showed findings worrisome for developing interstitial pneumonia in the right upper lobe.   HISTORY:  The patient is a 74 year old male with past medical history significant for history of gastric as well as lung carcinoma, status post pneumonectomy in the past, who presented to the hospital with complaints of shortness of breath, cough and wheezing. Apparently, the patient was hospitalized for vomiting blood. He had EGD, and was discharged to home in stable condition. He presented back with shortness of breath. On arrival to the Emergency Room, the patient's temperature was 98.5, pulse 101, respiratory rate was 24, blood pressure 138/80, and O2 sats were 88% on room air. Physical exam revealed bilateral wheezes throughout the lungs, with occasional rhonchi. No accessory muscle use. Otherwise, physical exam was unremarkable.   The patient's chest x-ray revealed a complete opacification of most of the left lung; right mid lung, increased markings suggestive of possible congestive heart failure, according to admitting physician. However, radiologist reported chest x-ray as density in left hemithorax, no aeration of left lung, patchy increased density in right lung base was also noted, concerning for possibility of pneumonia.   The patient was admitted to hospital for  further evaluation. He was admitted with COPD exacerbation  and started on antibiotics as well as steroids; however, his condition significantly did not improve. He was consulted by Dr. Sherrlyn HockPandit, who recommended CT-guided biopsy of the right lower rib mass, which was performed on 07/17/2012 and was consistent with carcinoma  with immunostains pending. Due to patient's extreme poor physical condition as well as tachypnea at rest and poor nourishment,  his performance status was qualified as ECoG 3 to 4. By the time it was  explained to patient and his daughter, Clide CliffLaverne, over the phone, the pathology report confirmed malignancy and that the patient had stage IV metastatic malignancy, likely lung primary, which was incurable, and treatments offered have only palliative intent. Dr. Sherrlyn HockPandit also explained that patient is not a good candidate for any kind of palliative treatment due to his poor performance status. The patient did not want to pursue any cancer treatment. Dr. Sherrlyn HockPandit recommended patient enter hospice, for which patient was agreeable. The patient was consulted by Palliative Care, and when a bed became available in the hospice home, patient was transferred to hospice home in poor condition with above-mentioned medications and no followup. So, final care is discharge.  VITAL SIGNS:  Temperature was 97.5, pulse ranging from 100 to 115, respiration rate was 18 to 22, blood pressure ranging from 128 to 169 systolic and 80s to 90s diastolic. O2 sats were 95% to 96% on 3 to 4 liters of oxygen per nasal cannula at rest.   TIME SPENT: 40 minutes.   ____________________________ Katharina Caperima Keyonda Bickle, MD rv:mr D: 07/20/2012 19:44:33 ET T: 07/20/2012 21:10:14 ET JOB#: [id number]  cc: Katharina Caperima Tensley Wery, MD, <Dictator> Sandeep R. Sherrlyn HockPandit, MD   Katharina CaperIMA Cordell Guercio MD ELECTRONICALLY SIGNED 07/26/2012 11:38

## 2014-05-09 NOTE — Consult Note (Signed)
Brief Consult Note: Diagnosis: Hematemesis.  History of lung cancer as well as stomach cancer.  Review of Dr. Darden DatesPandit's note 06/14/2012.  Anemia.  Alcohol abuse.   Consult note dictated.   Orders entered.   Discussed with Attending MD.   Comments: Patient's presentation discussed with Dr. Lutricia FeilPaul Oh.  Will advance diet for this evening to clear liquid diet.  NPO after midnight with goal to proceed with EGD tomorrow to allow direct luminal evaluation of upper GI tract to assess for source of blood loss.  Order placed.  Will continue to monitor laboratory studies and hemodynamic status. Continue with Protonix gtt as ordered.  PT/INR ordered for today with CBC at 17:00..  Electronic Signatures: Rodman KeyHarrison, Dawn S (NP)  (Signed 04-Jun-14 15:21)  Authored: Brief Consult Note   Last Updated: 04-Jun-14 15:21 by Rodman KeyHarrison, Dawn S (NP)

## 2014-05-09 NOTE — Consult Note (Signed)
ONCOLOGY followup note - Weak. Still has significant dyspnea and wheezing even at rest. Denies chest pain.   no fever.  No hemoptysispatient is weak and frail looking, mildly tachypneic at rest, otherwise alert and oriented, on [place] O2           vitals - afebrile, stable.  Pulse ox 98% on 4 L.          lungs - bilateral diminished breath sounds overall especially left side, bilateral rhonchi          abd - soft, nontender. 07/16/12 - hemoglobin 12.1, WBC 9600, platelets 79214.  [id number] year old gentleman with history of multiple medical problems as described above, including prior history of left lung cancer, and more recently found to have abnormal PET scan, with lesions in the mediastinum, right chest wall and right supraclavicular region. The patient had CT-guided biopsy of right lower rib mass, per discussion with pathologist Dr.Olney it is consistent with carcinoma with immunostains pending.  Patient's general condition is extremely poor, he is having tachypnea even at rest and is poorly nourished.  Performance status ECOG 3-4.  Have explained to patient (and his daughter Clide CliffLaverne over the phone) about pathology report confirming malignancy, that he has stage IV metastatic malignancy likely lung primary which is incurable and treatments offered are with palliative intent only.  Have also explained that he is not a good candidate for any kind of palliative treatment given extremely poor performance status.  Patient also does not want to pursue any cancer treatment.  Have recommended pursuing hospice to which they are agreeable, and they will discuss/decide if they want to do hospice at home or if they want him to be transferred to the hospice home.  Will add Roxanol when necessary for pain or respiratory distress.  Patient is DNR.  Will continue to follow on as needed basis, please feel free to call me if any additional questions.   Electronic Signatures: Izola PricePandit, Jahzir Strohmeier Raj (MD)  (Signed on 02-Jul-14  21:07)  Authored  Last Updated: 02-Jul-14 21:07 by Izola PricePandit, Aime Carreras Raj (MD)

## 2014-05-09 NOTE — Discharge Summary (Signed)
PATIENT NAME:  Gabriel Avila, [name] MR#:  [id number] DATE OF BIRTH:  1940/08/20  DATE OF ADMISSION:  06/20/2012  DATE OF DISCHARGE:  06/22/2012  PRIMARY CARE PHYSICIAN:  Dr. Sherrlyn HockPandit  FINAL DIAGNOSES: 1.  Acute hemorrhagic anemia with hematemesis, GI bleed.  2.  Esophageal stricture, status post dilatation.  3.  Metastatic lung cancer.  4.  Hypertension.  5.  Sleep apnea.  6.  Failure to thrive.   MEDICATIONS ON DISCHARGE: Include omeprazole 20 mg twice a day, Cardizem CD 120 mg daily.   DIET:  Low sodium diet, mechanical soft.   ACTIVITY:  As tolerated.   FOLLOW UP:  With Dr. Sherrlyn HockPandit in one week.  REASON FOR ADMISSION:  The patient was admitted June 4 with vomiting blood. He was admitted to the hospital, started on Protonix drip and given IV fluid hydration.   LABORATORY AND RADIOLOGICAL DATA DURING THE HOSPITAL COURSE: Included an EKG that showed normal sinus rhythm, LVH. Troponin negative. Glucose 120, BUN 13, creatinine 1.39, sodium 131, potassium 4.1, chloride 97, CO2 of 25, calcium 9.2. Liver function tests:  Albumin low at 2.9. White blood cell count 5.6, H and H 12.9 and 38.0, platelet count of 229. Chest x-ray:  Near total atelectasis of the left lung present, slightly progressed since the previous study, right lung hyperinflated and clear. Urinalysis negative. Hemoglobin upon discharge 10.4. PT 13.7, INR 1.0.   CONSULTATIONS DURING THE HOSPITAL COURSE: Included Dr. Harvie JuniorPhifer of palliative care, Dr. Bluford Kaufmannh of Gastroenterology.   HOSPITAL COURSE:  Upper endoscopy was done that showed benign-appearing esophageal stricture, which was dilated, normal. Other exam:  Normal stomach, normal duodenum.   1.  For the patient's acute hemorrhagic anemia, hematemesis and gastrointestinal bleed, no bleeding source was found. No masses were seen. Hemoglobin remained on the lower side, but stable. No need for transfusion during the hospital course.   2.  Esophageal stricture, status post dilatation. The  patient was started on a soft diet. If he tolerates this, can go home.   3.  Metastatic lung cancer. Case discussed with Dr. Sherrlyn HockPandit. He will follow up in his office. They may try to biopsy a lesion on the chest, and can consider chemotherapy after that. If he decides not to do any chemotherapy for the lung cancer, then can consider hospice care. The patient was made a DO NOT RESUSCITATE during the hospital course.   4.  For the patient's hypertension, initially he was borderline on his blood pressure, and his Norvasc was held. Since his heart rate is up slightly, I did switch the Norvasc over to Cardizem upon discharge home.   5.  For his sleep apnea, he uses CPAP at night.   6.  Failure to thrive. Likely secondary to his lung cancer. Can go home on soft diet.   VITAL SIGNS UPON DISCHARGE: Temperature 98.6, pulse 74, respirations 18, blood pressure 150/81, pulse ox 98% at rest on room air.   Overall prognosis is poor because it is linked to his metastatic lung cancer.   Time spent on discharge:  35 minutes.    ____________________________ Herschell Dimesichard J. Renae GlossWieting, MD rjw:mr D: 06/22/2012 14:17:00 ET T: 06/22/2012 22:31:50 ET JOB#: [id number]  cc: Herschell Dimesichard J. Renae GlossWieting, MD, <Dictator> Sandeep R. Sherrlyn HockPandit, MD  Salley ScarletICHARD J Latysha Thackston MD ELECTRONICALLY SIGNED 06/23/2012 15:14
